# Patient Record
Sex: Female | Born: 1935 | Race: White | Hispanic: No | Smoking: Former smoker
Health system: Southern US, Community
[De-identification: ages and names within clinical notes are randomized; demographics above are authoritative.]

## PROBLEM LIST (undated history)

## (undated) DIAGNOSIS — I4891 Unspecified atrial fibrillation: Secondary | ICD-10-CM

## (undated) DIAGNOSIS — M1711 Unilateral primary osteoarthritis, right knee: Secondary | ICD-10-CM

## (undated) DIAGNOSIS — J45909 Unspecified asthma, uncomplicated: Secondary | ICD-10-CM

## (undated) DIAGNOSIS — Z860100 Personal history of colon polyps, unspecified: Secondary | ICD-10-CM

## (undated) DIAGNOSIS — C50919 Malignant neoplasm of unspecified site of unspecified female breast: Secondary | ICD-10-CM

## (undated) DIAGNOSIS — M1990 Unspecified osteoarthritis, unspecified site: Secondary | ICD-10-CM

## (undated) DIAGNOSIS — I639 Cerebral infarction, unspecified: Secondary | ICD-10-CM

## (undated) DIAGNOSIS — N6011 Diffuse cystic mastopathy of right breast: Secondary | ICD-10-CM

## (undated) DIAGNOSIS — K219 Gastro-esophageal reflux disease without esophagitis: Secondary | ICD-10-CM

## (undated) DIAGNOSIS — I359 Nonrheumatic aortic valve disorder, unspecified: Secondary | ICD-10-CM

## (undated) DIAGNOSIS — I10 Essential (primary) hypertension: Secondary | ICD-10-CM

## (undated) DIAGNOSIS — E785 Hyperlipidemia, unspecified: Secondary | ICD-10-CM

## (undated) DIAGNOSIS — N6012 Diffuse cystic mastopathy of left breast: Secondary | ICD-10-CM

## (undated) DIAGNOSIS — N1831 Chronic kidney disease, stage 3a: Secondary | ICD-10-CM

## (undated) HISTORY — PX: TONSILLECTOMY: SUR1361

## (undated) HISTORY — PX: CATARACT EXTRACTION W/ INTRAOCULAR LENS  IMPLANT, BILATERAL: SHX1307

## (undated) HISTORY — PX: COLONOSCOPY: SHX174

## (undated) HISTORY — PX: APPENDECTOMY: SHX54

## (undated) HISTORY — PX: EYE SURGERY: SHX253

---

## 1966-05-08 HISTORY — PX: ABDOMINAL HYSTERECTOMY: SHX81

## 1971-05-09 HISTORY — PX: OTHER SURGICAL HISTORY: SHX169

## 1984-05-08 HISTORY — PX: BREAST EXCISIONAL BIOPSY: SUR124

## 2002-05-08 DIAGNOSIS — C50919 Malignant neoplasm of unspecified site of unspecified female breast: Secondary | ICD-10-CM

## 2002-05-08 HISTORY — PX: MASTECTOMY: SHX3

## 2002-05-08 HISTORY — DX: Malignant neoplasm of unspecified site of unspecified female breast: C50.919

## 2004-03-08 ENCOUNTER — Ambulatory Visit: Payer: Self-pay | Admitting: Obstetrics and Gynecology

## 2004-03-14 ENCOUNTER — Ambulatory Visit: Payer: Self-pay | Admitting: Obstetrics and Gynecology

## 2004-04-07 ENCOUNTER — Ambulatory Visit: Payer: Self-pay | Admitting: Oncology

## 2004-04-27 ENCOUNTER — Ambulatory Visit: Payer: Self-pay | Admitting: Internal Medicine

## 2004-05-03 ENCOUNTER — Ambulatory Visit: Payer: Self-pay | Admitting: Internal Medicine

## 2004-05-17 ENCOUNTER — Ambulatory Visit: Payer: Self-pay | Admitting: Oncology

## 2004-06-08 ENCOUNTER — Ambulatory Visit: Payer: Self-pay | Admitting: Oncology

## 2004-07-08 ENCOUNTER — Ambulatory Visit: Payer: Self-pay

## 2004-08-01 ENCOUNTER — Ambulatory Visit: Payer: Self-pay | Admitting: Oncology

## 2004-08-08 ENCOUNTER — Ambulatory Visit: Payer: Self-pay | Admitting: Surgery

## 2004-08-09 ENCOUNTER — Ambulatory Visit: Payer: Self-pay | Admitting: Oncology

## 2004-10-04 ENCOUNTER — Ambulatory Visit: Payer: Self-pay | Admitting: Oncology

## 2005-04-04 ENCOUNTER — Ambulatory Visit: Payer: Self-pay | Admitting: Oncology

## 2005-08-30 ENCOUNTER — Ambulatory Visit: Payer: Self-pay | Admitting: Surgery

## 2005-10-02 ENCOUNTER — Ambulatory Visit: Payer: Self-pay | Admitting: Oncology

## 2006-03-12 ENCOUNTER — Ambulatory Visit: Payer: Self-pay | Admitting: Internal Medicine

## 2006-03-23 ENCOUNTER — Ambulatory Visit: Payer: Self-pay | Admitting: Internal Medicine

## 2006-04-02 ENCOUNTER — Ambulatory Visit: Payer: Self-pay | Admitting: Oncology

## 2006-05-08 DIAGNOSIS — I639 Cerebral infarction, unspecified: Secondary | ICD-10-CM | POA: Diagnosis present

## 2006-05-08 HISTORY — DX: Cerebral infarction, unspecified: I63.9

## 2006-06-12 ENCOUNTER — Ambulatory Visit: Payer: Self-pay | Admitting: Gastroenterology

## 2006-09-17 ENCOUNTER — Ambulatory Visit: Payer: Self-pay | Admitting: Surgery

## 2006-10-07 ENCOUNTER — Ambulatory Visit: Payer: Self-pay | Admitting: Oncology

## 2006-10-08 ENCOUNTER — Ambulatory Visit: Payer: Self-pay | Admitting: Oncology

## 2006-11-06 ENCOUNTER — Ambulatory Visit: Payer: Self-pay | Admitting: Oncology

## 2007-04-08 ENCOUNTER — Ambulatory Visit: Payer: Self-pay | Admitting: Oncology

## 2007-04-11 ENCOUNTER — Ambulatory Visit: Payer: Self-pay | Admitting: Oncology

## 2007-05-09 ENCOUNTER — Ambulatory Visit: Payer: Self-pay | Admitting: Oncology

## 2007-05-14 ENCOUNTER — Ambulatory Visit: Payer: Self-pay | Admitting: Oncology

## 2007-06-09 ENCOUNTER — Ambulatory Visit: Payer: Self-pay | Admitting: Oncology

## 2007-07-22 ENCOUNTER — Inpatient Hospital Stay: Payer: Self-pay | Admitting: Specialist

## 2007-07-22 ENCOUNTER — Other Ambulatory Visit: Payer: Self-pay

## 2007-09-20 ENCOUNTER — Ambulatory Visit: Payer: Self-pay | Admitting: Internal Medicine

## 2007-10-23 ENCOUNTER — Ambulatory Visit: Payer: Self-pay | Admitting: Oncology

## 2007-11-06 ENCOUNTER — Ambulatory Visit: Payer: Self-pay | Admitting: Oncology

## 2008-09-22 ENCOUNTER — Ambulatory Visit: Payer: Self-pay | Admitting: Internal Medicine

## 2008-10-06 ENCOUNTER — Ambulatory Visit: Payer: Self-pay | Admitting: Oncology

## 2008-10-26 ENCOUNTER — Ambulatory Visit: Payer: Self-pay | Admitting: Oncology

## 2008-11-05 ENCOUNTER — Ambulatory Visit: Payer: Self-pay | Admitting: Oncology

## 2009-09-23 ENCOUNTER — Ambulatory Visit: Payer: Self-pay | Admitting: Internal Medicine

## 2009-10-06 ENCOUNTER — Ambulatory Visit: Payer: Self-pay | Admitting: Oncology

## 2009-11-04 ENCOUNTER — Ambulatory Visit: Payer: Self-pay | Admitting: Oncology

## 2009-11-05 ENCOUNTER — Ambulatory Visit: Payer: Self-pay | Admitting: Oncology

## 2010-09-26 ENCOUNTER — Ambulatory Visit: Payer: Self-pay | Admitting: Internal Medicine

## 2010-11-22 ENCOUNTER — Ambulatory Visit: Payer: Self-pay | Admitting: Oncology

## 2010-12-07 ENCOUNTER — Ambulatory Visit: Payer: Self-pay | Admitting: Oncology

## 2011-05-09 HISTORY — PX: ESOPHAGOGASTRODUODENOSCOPY: SHX1529

## 2011-10-03 ENCOUNTER — Ambulatory Visit: Payer: Self-pay | Admitting: Internal Medicine

## 2011-11-08 ENCOUNTER — Ambulatory Visit: Payer: Self-pay | Admitting: Internal Medicine

## 2011-12-28 ENCOUNTER — Ambulatory Visit: Payer: Self-pay | Admitting: Gastroenterology

## 2012-01-04 ENCOUNTER — Ambulatory Visit: Payer: Self-pay | Admitting: Gastroenterology

## 2012-10-03 ENCOUNTER — Ambulatory Visit: Payer: Self-pay | Admitting: Internal Medicine

## 2013-10-20 ENCOUNTER — Ambulatory Visit: Payer: Self-pay

## 2014-05-28 ENCOUNTER — Ambulatory Visit: Payer: Self-pay | Admitting: Ophthalmology

## 2014-05-28 DIAGNOSIS — I10 Essential (primary) hypertension: Secondary | ICD-10-CM

## 2014-05-28 LAB — POTASSIUM: Potassium: 4.5 mmol/L (ref 3.5–5.1)

## 2014-06-11 ENCOUNTER — Ambulatory Visit: Payer: Self-pay | Admitting: Ophthalmology

## 2014-06-30 ENCOUNTER — Ambulatory Visit: Payer: Self-pay | Admitting: Ophthalmology

## 2014-07-09 ENCOUNTER — Ambulatory Visit: Payer: Self-pay | Admitting: Ophthalmology

## 2014-09-06 NOTE — Op Note (Signed)
PATIENT NAME:  Colleen, [name] MR#:  [id number] DATE OF BIRTH:  1930-05-24  DATE OF PROCEDURE:  06/11/2014  PREOPERATIVE DIAGNOSIS:  Nuclear sclerotic cataract left eye.  ICD-10 H25.12  POSTOPERATIVE DIAGNOSIS:  Nuclear sclerotic cataract left eye.  PROCEDURE:  Phacoemulsification with posterior chamber intraocular lens implantation of the left eye.   LENS:  ZCB00 22.5-diopter posterior chamber intraocular lens.  ULTRASOUND TIME:  16% of 1 minute, 54 seconds.  CDE 18.5.  SURGEON:  Mali Morocco Gipe, MD  ANESTHESIA:  Topical with tetracaine drops and 2% Xylocaine jelly.  COMPLICATIONS:  None.  DESCRIPTION OF PROCEDURE:  The patient was identified in the holding room and transported to the operating room and placed in the supine position under the operating microscope.  The left eye was identified as the operative eye and it was prepped and draped in the usual sterile ophthalmic fashion.  A 1 millimeter clear-corneal paracentesis was made at the 1:30 position.  The anterior chamber was filled with Viscoat viscoelastic.  A 2.4 millimeter keratome was used to make a near-clear corneal incision at the 10:30 position.  A curvilinear capsulorrhexis was made with a cystotome and capsulorrhexis forceps.  Balanced salt solution was used to hydrodissect and hydrodelineate the nucleus.  Phacoemulsification was then used in stop and chop fashion to remove the lens nucleus and epinucleus.  The remaining cortex was then removed using the irrigation and aspiration handpiece. Provisc was then placed into the capsular bag to distend it for lens placement.  A ZCB00 22.5-diopter lens was then injected into the capsular bag.  The remaining viscoelastic was aspirated.  Wounds were hydrated with balanced salt solution.  The anterior chamber was inflated to a physiologic pressure with balanced salt solution.  0.1 mL of cefuroxime 10 mg/mL were injected into the anterior chamber for a dose of 1 mg of intracameral  antibiotic at the completion of the case. Miostat was placed into the anterior chamber to constrict the pupil.  No wound leaks were noted.  Topical Vigamox drops and Maxitrol ointment were applied to the eye.  The patient was taken to the recovery room in stable condition without complications of anesthesia or surgery.   ____________________________ Wyonia Hough, MD crb:mw D: 06/11/2014 09:01:31 ET T: 06/11/2014 11:42:32 ET JOB#: [id number]  cc: Wyonia Hough, MD, <Dictator> Leandrew Koyanagi MD ELECTRONICALLY SIGNED 06/17/2014 13:27

## 2014-10-01 ENCOUNTER — Other Ambulatory Visit: Payer: Self-pay | Admitting: Infectious Diseases

## 2014-10-01 DIAGNOSIS — Z1231 Encounter for screening mammogram for malignant neoplasm of breast: Secondary | ICD-10-CM

## 2014-10-22 ENCOUNTER — Ambulatory Visit
Admission: RE | Admit: 2014-10-22 | Discharge: 2014-10-22 | Disposition: A | Discharge status: Home / Self Care | Payer: Medicare Other | Source: Ambulatory Visit | Attending: Infectious Diseases | Admitting: Infectious Diseases

## 2014-10-22 ENCOUNTER — Other Ambulatory Visit: Payer: Self-pay | Admitting: Infectious Diseases

## 2014-10-22 DIAGNOSIS — Z9012 Acquired absence of left breast and nipple: Secondary | ICD-10-CM | POA: Insufficient documentation

## 2014-10-22 DIAGNOSIS — Z1231 Encounter for screening mammogram for malignant neoplasm of breast: Secondary | ICD-10-CM

## 2014-11-05 ENCOUNTER — Encounter: Payer: Self-pay | Admitting: Podiatry

## 2014-11-05 ENCOUNTER — Ambulatory Visit (INDEPENDENT_AMBULATORY_CARE_PROVIDER_SITE_OTHER): Payer: Medicare Other | Admitting: Podiatry

## 2014-11-05 VITALS — BP 127/67 | HR 76 | Resp 16 | Wt 154.0 lb

## 2014-11-05 DIAGNOSIS — L600 Ingrowing nail: Secondary | ICD-10-CM

## 2014-11-05 NOTE — Patient Instructions (Signed)
Ingrown Toenail An ingrown toenail occurs when the sharp edge of your toenail grows into the skin. Causes of ingrown toenails include toenails clipped too far back or poorly fitting shoes. Activities involving sudden stops (basketball, tennis) causing "toe jamming" may lead to an ingrown nail. HOME CARE INSTRUCTIONS   Soak the whole foot in warm soapy water for 20 minutes, 3 times per day.  You may lift the edge of the nail away from the sore skin by wedging a small piece of cotton under the corner of the nail. Be careful not to dig (traumatize) and cause more injury to the area.  Wear shoes that fit well. While the ingrown nail is causing problems, sandals may be beneficial.  Trim your toenails regularly and carefully. Cut your toenails straight across, not in a curve. This will prevent injury to the skin at the corners of the toenail.  Keep your feet clean and dry.  Crutches may be helpful early in treatment if walking is painful.  Antibiotics, if prescribed, should be taken as directed.  Return for a wound check in 2 days or as directed.  Only take over-the-counter or prescription medicines for pain, discomfort, or fever as directed by your caregiver. SEEK IMMEDIATE MEDICAL CARE IF:   You have a fever.  You have increasing pain, redness, swelling, or heat at the wound site.  Your toe is not better in 7 days. If conservative treatment is not successful, surgical removal of a portion or all of the nail may be necessary. MAKE SURE YOU:   Understand these instructions.  Will watch your condition.  Will get help right away if you are not doing well or get worse. Document Released: 04/21/2000 Document Revised: 07/17/2011 Document Reviewed: 04/15/2008 Exit[employer] Patient Information 2015 [employer]. This information is not intended to replace advice given to you by your health care provider. Make sure you discuss any questions you have with your health care provider.  

## 2014-11-06 NOTE — Progress Notes (Signed)
Subjective:     Patient ID: Colleen [name], female   DOB: 22-Dec-1930, 81 y.o.   MRN: [id number]  HPI 81 year old female presents the office with concerns of bilateral hallux ingrown toenails which is been ongoing for several weeks. She states that she had a pedicure proximal 6-8 weeks ago and since then she presents today were cut to short she has developed ingrown toenails. She states that she has some mild discomfort along the nails on into the toenails. She denies any redness or any swelling. She denies any drainage or purulence. She said no prior treatment. No other complaints at this time.  Review of Systems  All other systems reviewed and are negative.      Objective:   Physical Exam AAO x3, NAD DP/PT pulses palpable bilaterally, CRT less than 3 seconds Protective sensation intact with Simms Weinstein monofilament, vibratory sensation intact, Achilles tendon reflex intact Bilateral hallux toenails are incurvated on both the medial and lateral aspects of the toenail. There is tenderness up amputation on the distal aspect of the toenail however there is no pain on the proximal nail borders. There is no surrounding edema, erythema, increase in warmth, drainage/purulence. There is no clinical signs of infection. Remaining nails are also slightly incurvated however there without pain or any surrounding redness or drainage. No other areas of tenderness to bilateral lower extremities. MMT 5/5, ROM WNL.  No open lesions or pre-ulcerative lesions.  No overlying edema, erythema, increase in warmth to bilateral lower extremities.  No pain with calf compression, swelling, warmth, erythema bilaterally.      Assessment:     81 year old female with bilateral hallux ingrown toenail    Plan:     -Treatment options discussed including all alternatives, risks, and complications -Discussed possible partial nail avulsion versus nail debridement. Patient wishes to hold off on a nail procedure. Bilateral  hallux nails are sharply debrided without complications to remove the symptomatic portion of the ingrown toenail without complication/bleeding. Continue to monitor for any reoccurrence. Monitor for any clinical signs or symptoms of infection and directed to call the office immediately should any occur or go to the ER. -Follow-up as needed. In the meantime I encouraged her to call the office with any questions, concerns, change in symptoms.  Celesta Gentile, DPM

## 2015-09-10 ENCOUNTER — Other Ambulatory Visit: Payer: Self-pay | Admitting: Infectious Diseases

## 2015-09-10 DIAGNOSIS — Z1231 Encounter for screening mammogram for malignant neoplasm of breast: Secondary | ICD-10-CM

## 2015-10-25 ENCOUNTER — Ambulatory Visit
Admission: RE | Admit: 2015-10-25 | Discharge: 2015-10-25 | Disposition: A | Discharge status: Home / Self Care | Payer: Medicare Other | Source: Ambulatory Visit | Attending: Infectious Diseases | Admitting: Infectious Diseases

## 2015-10-25 ENCOUNTER — Other Ambulatory Visit: Payer: Self-pay | Admitting: Infectious Diseases

## 2015-10-25 DIAGNOSIS — Z1231 Encounter for screening mammogram for malignant neoplasm of breast: Secondary | ICD-10-CM

## 2015-10-25 HISTORY — DX: Malignant neoplasm of unspecified site of unspecified female breast: C50.919

## 2016-09-13 ENCOUNTER — Other Ambulatory Visit: Payer: Self-pay | Admitting: Infectious Diseases

## 2016-09-13 DIAGNOSIS — Z1231 Encounter for screening mammogram for malignant neoplasm of breast: Secondary | ICD-10-CM

## 2016-10-25 ENCOUNTER — Ambulatory Visit
Admission: RE | Admit: 2016-10-25 | Discharge: 2016-10-25 | Disposition: A | Discharge status: Home / Self Care | Payer: Medicare Other | Source: Ambulatory Visit | Attending: Infectious Diseases | Admitting: Infectious Diseases

## 2016-10-25 DIAGNOSIS — Z1231 Encounter for screening mammogram for malignant neoplasm of breast: Secondary | ICD-10-CM | POA: Insufficient documentation

## 2017-05-08 DIAGNOSIS — M1711 Unilateral primary osteoarthritis, right knee: Secondary | ICD-10-CM

## 2017-05-08 HISTORY — DX: Unilateral primary osteoarthritis, right knee: M17.11

## 2017-09-13 ENCOUNTER — Other Ambulatory Visit: Payer: Self-pay | Admitting: Infectious Diseases

## 2017-09-13 DIAGNOSIS — Z1231 Encounter for screening mammogram for malignant neoplasm of breast: Secondary | ICD-10-CM

## 2017-09-25 ENCOUNTER — Other Ambulatory Visit: Payer: Self-pay | Admitting: Student

## 2017-09-25 DIAGNOSIS — M1711 Unilateral primary osteoarthritis, right knee: Secondary | ICD-10-CM

## 2017-10-09 ENCOUNTER — Ambulatory Visit
Admission: RE | Admit: 2017-10-09 | Discharge: 2017-10-09 | Disposition: A | Discharge status: Home / Self Care | Payer: Medicare Other | Source: Ambulatory Visit | Attending: Student | Admitting: Student

## 2017-10-09 DIAGNOSIS — S83241A Other tear of medial meniscus, current injury, right knee, initial encounter: Secondary | ICD-10-CM | POA: Insufficient documentation

## 2017-10-09 DIAGNOSIS — R600 Localized edema: Secondary | ICD-10-CM | POA: Insufficient documentation

## 2017-10-09 DIAGNOSIS — M1711 Unilateral primary osteoarthritis, right knee: Secondary | ICD-10-CM | POA: Insufficient documentation

## 2017-10-09 DIAGNOSIS — M7121 Synovial cyst of popliteal space [Baker], right knee: Secondary | ICD-10-CM | POA: Insufficient documentation

## 2017-10-09 DIAGNOSIS — M25461 Effusion, right knee: Secondary | ICD-10-CM | POA: Diagnosis not present

## 2017-10-09 DIAGNOSIS — X58XXXA Exposure to other specified factors, initial encounter: Secondary | ICD-10-CM | POA: Insufficient documentation

## 2017-10-29 ENCOUNTER — Other Ambulatory Visit: Payer: Self-pay | Admitting: Infectious Diseases

## 2017-10-29 ENCOUNTER — Ambulatory Visit
Admission: RE | Admit: 2017-10-29 | Discharge: 2017-10-29 | Disposition: A | Discharge status: Home / Self Care | Payer: Medicare Other | Source: Ambulatory Visit | Attending: Infectious Diseases | Admitting: Infectious Diseases

## 2017-10-29 DIAGNOSIS — Z1231 Encounter for screening mammogram for malignant neoplasm of breast: Secondary | ICD-10-CM

## 2017-10-31 ENCOUNTER — Other Ambulatory Visit: Payer: Self-pay

## 2017-10-31 ENCOUNTER — Encounter
Admission: RE | Admit: 2017-10-31 | Discharge: 2017-10-31 | Disposition: A | Discharge status: Home / Self Care | Payer: Medicare Other | Source: Ambulatory Visit | Attending: Surgery | Admitting: Surgery

## 2017-10-31 ENCOUNTER — Ambulatory Visit
Admission: RE | Admit: 2017-10-31 | Discharge: 2017-10-31 | Disposition: A | Discharge status: Home / Self Care | Payer: Medicare Other | Source: Ambulatory Visit | Attending: Surgery | Admitting: Surgery

## 2017-10-31 DIAGNOSIS — Z01818 Encounter for other preprocedural examination: Secondary | ICD-10-CM | POA: Diagnosis present

## 2017-10-31 DIAGNOSIS — Z01810 Encounter for preprocedural cardiovascular examination: Secondary | ICD-10-CM | POA: Diagnosis present

## 2017-10-31 DIAGNOSIS — M1711 Unilateral primary osteoarthritis, right knee: Secondary | ICD-10-CM | POA: Diagnosis not present

## 2017-10-31 DIAGNOSIS — Z8709 Personal history of other diseases of the respiratory system: Secondary | ICD-10-CM

## 2017-10-31 DIAGNOSIS — I4891 Unspecified atrial fibrillation: Secondary | ICD-10-CM | POA: Insufficient documentation

## 2017-10-31 DIAGNOSIS — Z01812 Encounter for preprocedural laboratory examination: Secondary | ICD-10-CM | POA: Diagnosis present

## 2017-10-31 HISTORY — DX: Gastro-esophageal reflux disease without esophagitis: K21.9

## 2017-10-31 HISTORY — DX: Diffuse cystic mastopathy of right breast: N60.11

## 2017-10-31 HISTORY — DX: Hyperlipidemia, unspecified: E78.5

## 2017-10-31 HISTORY — DX: Essential (primary) hypertension: I10

## 2017-10-31 HISTORY — DX: Malignant neoplasm of unspecified site of unspecified female breast: C50.919

## 2017-10-31 HISTORY — DX: Unilateral primary osteoarthritis, right knee: M17.11

## 2017-10-31 HISTORY — DX: Personal history of colonic polyps: Z86.010

## 2017-10-31 HISTORY — DX: Unspecified osteoarthritis, unspecified site: M19.90

## 2017-10-31 HISTORY — DX: Cerebral infarction, unspecified: I63.9

## 2017-10-31 HISTORY — DX: Diffuse cystic mastopathy of left breast: N60.12

## 2017-10-31 HISTORY — DX: Personal history of colon polyps, unspecified: Z86.0100

## 2017-10-31 HISTORY — DX: Unspecified asthma, uncomplicated: J45.909

## 2017-10-31 HISTORY — DX: Nonrheumatic aortic valve disorder, unspecified: I35.9

## 2017-10-31 HISTORY — DX: Unspecified atrial fibrillation: I48.91

## 2017-10-31 LAB — URINALYSIS, ROUTINE W REFLEX MICROSCOPIC
BILIRUBIN URINE: NEGATIVE
Glucose, UA: NEGATIVE mg/dL
Hgb urine dipstick: NEGATIVE
KETONES UR: NEGATIVE mg/dL
LEUKOCYTES UA: NEGATIVE
NITRITE: NEGATIVE
PH: 5 (ref 5.0–8.0)
PROTEIN: NEGATIVE mg/dL
Specific Gravity, Urine: 1.009 (ref 1.005–1.030)

## 2017-10-31 LAB — BASIC METABOLIC PANEL
ANION GAP: 11 (ref 5–15)
BUN: 11 mg/dL (ref 8–23)
CHLORIDE: 105 mmol/L (ref 98–111)
CO2: 27 mmol/L (ref 22–32)
Calcium: 10.1 mg/dL (ref 8.9–10.3)
Creatinine, Ser: 0.84 mg/dL (ref 0.44–1.00)
Glucose, Bld: 90 mg/dL (ref 70–99)
POTASSIUM: 3.6 mmol/L (ref 3.5–5.1)
SODIUM: 143 mmol/L (ref 135–145)

## 2017-10-31 LAB — CBC
HCT: 42.7 % (ref 35.0–47.0)
HEMOGLOBIN: 14.1 g/dL (ref 12.0–16.0)
MCH: 31.6 pg (ref 26.0–34.0)
MCHC: 33.1 g/dL (ref 32.0–36.0)
MCV: 95.5 fL (ref 80.0–100.0)
PLATELETS: 196 10*3/uL (ref 150–440)
RBC: 4.47 MIL/uL (ref 3.80–5.20)
RDW: 12.9 % (ref 11.5–14.5)
WBC: 6.8 10*3/uL (ref 3.6–11.0)

## 2017-10-31 LAB — PROTIME-INR
INR: 1.4
PROTHROMBIN TIME: 17 s — AB (ref 11.4–15.2)

## 2017-10-31 LAB — TYPE AND SCREEN
ABO/RH(D): A POS
Antibody Screen: NEGATIVE

## 2017-10-31 LAB — SURGICAL PCR SCREEN
MRSA, PCR: NEGATIVE
STAPHYLOCOCCUS AUREUS: NEGATIVE

## 2017-10-31 NOTE — Patient Instructions (Signed)
Your procedure is scheduled on:  Tuesday, November 13, 2017 Report to Day Surgery on the 2nd floor of the [facility]. To find out your arrival time, please call [phone] between 1PM - 3PM on: Monday, November 12, 2017  REMEMBER: Instructions that are not followed completely may result in serious medical risk, up to and including death; or upon the discretion of your surgeon and anesthesiologist your surgery may need to be rescheduled.  Do not eat food after midnight the night before your procedure.  No gum chewing, lozengers or hard candies.  You may however, drink CLEAR liquids up to 2 hours before you are scheduled to arrive for your surgery. Do not drink anything within 2 hours of the start of your surgery.  Clear liquids include: - water  - apple juice without pulp - clear gatorade - black coffee or tea (Do NOT add anything to the coffee or tea) Do NOT drink anything that is not on this list.  No Alcohol for 24 hours before or after surgery.  No Smoking including e-cigarettes for 24 hours prior to surgery.  No chewable tobacco products for at least 6 hours prior to surgery.  No nicotine patches on the day of surgery.  On the morning of surgery brush your teeth with toothpaste and water, you may rinse your mouth with mouthwash if you wish. Do not swallow any toothpaste or mouthwash.  Notify your doctor if there is any change in your medical condition (cold, fever, infection).  Do not wear jewelry, make-up, hairpins, clips or nail polish.  Do not wear lotions, powders, or perfumes. You may wear deodorant.  Do not shave 48 hours prior to surgery.   Contacts and dentures may not be worn into surgery.  Do not bring valuables to the hospital, including drivers license, insurance or credit cards.  [facility] is not responsible for any belongings or valuables.   TAKE THESE MEDICATIONS THE MORNING OF SURGERY:  1.  ATENOLOL 2.  NIFEDIPINE  Use CHG Soap as directed on  instruction sheet.  Follow recommendations from Cardiologist regarding stopping Pradaxa. LAST DAY TO TAKE IS July 3. DO NOT TAKE July 4 THROUGH July 9. RESTART ON July 10.  ON JULY 2 - Stop Anti-inflammatories (NSAIDS) such as Advil, Aleve, Ibuprofen, Motrin, Naproxen, Naprosyn and Aspirin based products such as Excedrin, Goodys Powder, BC Powder. (May take Tylenol or Acetaminophen if needed.)  ON July 2 - Stop ANY OVER THE COUNTER supplements until after surgery. (CALCIUM, FOLIC ACID, OSTEO BI-FLEX, FISH OIL, VITAMIN C, VITAMIN E) (May continue Vitamin D, Vitamin B, and multivitamin.)  Wear comfortable clothing (specific to your surgery type) to the hospital.  Plan for stool softeners for home use.  If you are being admitted to the hospital overnight, leave your suitcase in the car. After surgery it may be brought to your room.  Please call [phone] if you have any questions about these instructions.

## 2017-11-02 NOTE — Pre-Procedure Instructions (Signed)
Confirmed EKG FYI faxed to Dr. Sharlet Salina office and Dr. Nicholaus Bloom office.

## 2017-11-13 ENCOUNTER — Inpatient Hospital Stay: Payer: Medicare Other

## 2017-11-13 ENCOUNTER — Inpatient Hospital Stay
Admission: RE | Admit: 2017-11-13 | Discharge: 2017-11-16 | DRG: 469 | Disposition: A | Discharge status: Skilled Nursing Facility | Payer: Medicare Other | Attending: Surgery | Admitting: Surgery

## 2017-11-13 ENCOUNTER — Encounter
Admission: RE | Disposition: A | Discharge status: Skilled Nursing Facility | Payer: Self-pay | Source: Ambulatory Visit | Attending: Surgery

## 2017-11-13 ENCOUNTER — Other Ambulatory Visit: Payer: Self-pay

## 2017-11-13 DIAGNOSIS — Z888 Allergy status to other drugs, medicaments and biological substances status: Secondary | ICD-10-CM

## 2017-11-13 DIAGNOSIS — Z9012 Acquired absence of left breast and nipple: Secondary | ICD-10-CM | POA: Diagnosis not present

## 2017-11-13 DIAGNOSIS — Z853 Personal history of malignant neoplasm of breast: Secondary | ICD-10-CM | POA: Diagnosis not present

## 2017-11-13 DIAGNOSIS — K219 Gastro-esophageal reflux disease without esophagitis: Secondary | ICD-10-CM | POA: Diagnosis present

## 2017-11-13 DIAGNOSIS — Z9842 Cataract extraction status, left eye: Secondary | ICD-10-CM

## 2017-11-13 DIAGNOSIS — Z87891 Personal history of nicotine dependence: Secondary | ICD-10-CM | POA: Diagnosis not present

## 2017-11-13 DIAGNOSIS — A419 Sepsis, unspecified organism: Secondary | ICD-10-CM | POA: Diagnosis not present

## 2017-11-13 DIAGNOSIS — Z8249 Family history of ischemic heart disease and other diseases of the circulatory system: Secondary | ICD-10-CM | POA: Diagnosis not present

## 2017-11-13 DIAGNOSIS — Z8673 Personal history of transient ischemic attack (TIA), and cerebral infarction without residual deficits: Secondary | ICD-10-CM | POA: Diagnosis not present

## 2017-11-13 DIAGNOSIS — E785 Hyperlipidemia, unspecified: Secondary | ICD-10-CM | POA: Diagnosis present

## 2017-11-13 DIAGNOSIS — Z9841 Cataract extraction status, right eye: Secondary | ICD-10-CM | POA: Diagnosis not present

## 2017-11-13 DIAGNOSIS — I359 Nonrheumatic aortic valve disorder, unspecified: Secondary | ICD-10-CM | POA: Diagnosis present

## 2017-11-13 DIAGNOSIS — Z7951 Long term (current) use of inhaled steroids: Secondary | ICD-10-CM | POA: Diagnosis not present

## 2017-11-13 DIAGNOSIS — Z961 Presence of intraocular lens: Secondary | ICD-10-CM | POA: Diagnosis present

## 2017-11-13 DIAGNOSIS — E876 Hypokalemia: Secondary | ICD-10-CM | POA: Diagnosis not present

## 2017-11-13 DIAGNOSIS — J9601 Acute respiratory failure with hypoxia: Secondary | ICD-10-CM | POA: Diagnosis not present

## 2017-11-13 DIAGNOSIS — Z803 Family history of malignant neoplasm of breast: Secondary | ICD-10-CM | POA: Diagnosis not present

## 2017-11-13 DIAGNOSIS — M1711 Unilateral primary osteoarthritis, right knee: Principal | ICD-10-CM | POA: Diagnosis present

## 2017-11-13 DIAGNOSIS — Z90710 Acquired absence of both cervix and uterus: Secondary | ICD-10-CM | POA: Diagnosis not present

## 2017-11-13 DIAGNOSIS — J9811 Atelectasis: Secondary | ICD-10-CM | POA: Diagnosis not present

## 2017-11-13 DIAGNOSIS — I480 Paroxysmal atrial fibrillation: Secondary | ICD-10-CM | POA: Diagnosis present

## 2017-11-13 DIAGNOSIS — Z96651 Presence of right artificial knee joint: Secondary | ICD-10-CM

## 2017-11-13 DIAGNOSIS — Z885 Allergy status to narcotic agent status: Secondary | ICD-10-CM

## 2017-11-13 DIAGNOSIS — I10 Essential (primary) hypertension: Secondary | ICD-10-CM | POA: Diagnosis present

## 2017-11-13 HISTORY — PX: TOTAL KNEE ARTHROPLASTY: SHX125

## 2017-11-13 LAB — PROTIME-INR
INR: 1
Prothrombin Time: 13.1 seconds (ref 11.4–15.2)

## 2017-11-13 LAB — ABO/RH: ABO/RH(D): A POS

## 2017-11-13 SURGERY — ARTHROPLASTY, KNEE, TOTAL
Anesthesia: Spinal | Site: Knee | Laterality: Right | Wound class: Clean

## 2017-11-13 MED ORDER — ONDANSETRON HCL 4 MG/2ML IJ SOLN: 4.0000 mg | Freq: Four times a day (QID) | INTRAMUSCULAR | Status: DC | PRN

## 2017-11-13 MED ORDER — LISINOPRIL 20 MG PO TABS
40.0000 mg | ORAL_TABLET | Freq: Two times a day (BID) | ORAL | Status: DC
  Administered 2017-11-13 – 2017-11-15 (×3): 40 mg via ORAL
  Filled 2017-11-13 (×4): qty 2

## 2017-11-13 MED ORDER — KETOROLAC TROMETHAMINE 30 MG/ML IJ SOLN
INTRAMUSCULAR | Status: AC
  Filled 2017-11-13: qty 1

## 2017-11-13 MED ORDER — CHOLECALCIFEROL 10 MCG (400 UNIT) PO TABS
400.0000 [IU] | ORAL_TABLET | Freq: Every day | ORAL | Status: DC
Start: 2017-11-14 — End: 2017-11-16
  Administered 2017-11-14 – 2017-11-16 (×3): 400 [IU] via ORAL
  Filled 2017-11-13 (×3): qty 1

## 2017-11-13 MED ORDER — BUPIVACAINE LIPOSOME 1.3 % IJ SUSP
INTRAMUSCULAR | Status: AC
  Filled 2017-11-13: qty 20

## 2017-11-13 MED ORDER — NEOMYCIN-POLYMYXIN B GU 40-200000 IR SOLN
Status: AC
  Filled 2017-11-13: qty 20

## 2017-11-13 MED ORDER — ONDANSETRON HCL 4 MG/2ML IJ SOLN
INTRAMUSCULAR | Status: DC | PRN
  Administered 2017-11-13: 4 mg via INTRAVENOUS

## 2017-11-13 MED ORDER — PANTOPRAZOLE SODIUM 40 MG PO TBEC
40.0000 mg | DELAYED_RELEASE_TABLET | Freq: Every day | ORAL | Status: DC
  Administered 2017-11-14 – 2017-11-16 (×3): 40 mg via ORAL
  Filled 2017-11-13 (×3): qty 1

## 2017-11-13 MED ORDER — TRAMADOL HCL 50 MG PO TABS
50.0000 mg | ORAL_TABLET | Freq: Four times a day (QID) | ORAL | Status: DC
  Administered 2017-11-13 – 2017-11-16 (×10): 50 mg via ORAL
  Filled 2017-11-13 (×10): qty 1

## 2017-11-13 MED ORDER — LACTATED RINGERS IV SOLN
INTRAVENOUS | Status: DC
  Administered 2017-11-13 (×2): via INTRAVENOUS

## 2017-11-13 MED ORDER — ONDANSETRON HCL 4 MG/2ML IJ SOLN
INTRAMUSCULAR | Status: AC
  Filled 2017-11-13: qty 2

## 2017-11-13 MED ORDER — LIDOCAINE HCL (CARDIAC) PF 100 MG/5ML IV SOSY
PREFILLED_SYRINGE | INTRAVENOUS | Status: DC | PRN
  Administered 2017-11-13: 50 mg via INTRAVENOUS

## 2017-11-13 MED ORDER — DEXAMETHASONE SODIUM PHOSPHATE 10 MG/ML IJ SOLN
INTRAMUSCULAR | Status: AC
  Filled 2017-11-13: qty 1

## 2017-11-13 MED ORDER — TRANEXAMIC ACID 1000 MG/10ML IV SOLN
INTRAVENOUS | Status: AC
  Filled 2017-11-13: qty 10

## 2017-11-13 MED ORDER — CEFAZOLIN SODIUM-DEXTROSE 2-4 GM/100ML-% IV SOLN
INTRAVENOUS | Status: AC
  Filled 2017-11-13: qty 100

## 2017-11-13 MED ORDER — BUPIVACAINE LIPOSOME 1.3 % IJ SUSP
INTRAMUSCULAR | Status: DC | PRN
  Administered 2017-11-13: 60 mL

## 2017-11-13 MED ORDER — PROPOFOL 10 MG/ML IV BOLUS
INTRAVENOUS | Status: AC
  Filled 2017-11-13: qty 20

## 2017-11-13 MED ORDER — ACETAMINOPHEN 10 MG/ML IV SOLN
INTRAVENOUS | Status: DC | PRN
  Administered 2017-11-13: 1000 mg via INTRAVENOUS

## 2017-11-13 MED ORDER — OXYCODONE HCL 5 MG/5ML PO SOLN: 5.0000 mg | Freq: Once | ORAL | Status: DC | PRN

## 2017-11-13 MED ORDER — VITAMIN C 500 MG PO TABS
1000.0000 mg | ORAL_TABLET | Freq: Every day | ORAL | Status: DC
  Administered 2017-11-14 – 2017-11-16 (×3): 1000 mg via ORAL
  Filled 2017-11-13 (×3): qty 2

## 2017-11-13 MED ORDER — SODIUM CHLORIDE FLUSH 0.9 % IV SOLN
INTRAVENOUS | Status: AC
  Filled 2017-11-13: qty 40

## 2017-11-13 MED ORDER — VITAMIN E 180 MG (400 UNIT) PO CAPS
400.0000 [IU] | ORAL_CAPSULE | Freq: Every day | ORAL | Status: DC
  Administered 2017-11-14 – 2017-11-15 (×2): 400 [IU] via ORAL
  Filled 2017-11-13 (×3): qty 1

## 2017-11-13 MED ORDER — SODIUM CHLORIDE 0.9 % IJ SOLN
INTRAMUSCULAR | Status: AC
  Filled 2017-11-13: qty 50

## 2017-11-13 MED ORDER — BUPIVACAINE HCL (PF) 0.5 % IJ SOLN
INTRAMUSCULAR | Status: AC
  Filled 2017-11-13: qty 10

## 2017-11-13 MED ORDER — TRANEXAMIC ACID 1000 MG/10ML IV SOLN
INTRAVENOUS | Status: DC | PRN
  Administered 2017-11-13: 1000 mg via TOPICAL

## 2017-11-13 MED ORDER — PROPOFOL 500 MG/50ML IV EMUL
INTRAVENOUS | Status: AC
  Filled 2017-11-13: qty 50

## 2017-11-13 MED ORDER — DOCUSATE SODIUM 100 MG PO CAPS
100.0000 mg | ORAL_CAPSULE | Freq: Two times a day (BID) | ORAL | Status: DC
  Administered 2017-11-13 – 2017-11-16 (×6): 100 mg via ORAL
  Filled 2017-11-13 (×6): qty 1

## 2017-11-13 MED ORDER — NEOMYCIN-POLYMYXIN B GU 40-200000 IR SOLN
Status: DC | PRN
  Administered 2017-11-13: 14 mL

## 2017-11-13 MED ORDER — FUROSEMIDE 20 MG PO TABS
20.0000 mg | ORAL_TABLET | Freq: Every day | ORAL | Status: DC
  Filled 2017-11-13: qty 1

## 2017-11-13 MED ORDER — SPIRONOLACTONE 25 MG PO TABS
25.0000 mg | ORAL_TABLET | Freq: Every day | ORAL | Status: DC
  Administered 2017-11-15: 25 mg via ORAL
  Filled 2017-11-13: qty 1

## 2017-11-13 MED ORDER — NIFEDIPINE ER OSMOTIC RELEASE 30 MG PO TB24
30.0000 mg | ORAL_TABLET | Freq: Every day | ORAL | Status: DC
  Administered 2017-11-14 – 2017-11-15 (×2): 30 mg via ORAL
  Filled 2017-11-13 (×3): qty 1

## 2017-11-13 MED ORDER — OMEGA-3-ACID ETHYL ESTERS 1 G PO CAPS
1.0000 g | ORAL_CAPSULE | Freq: Every day | ORAL | Status: DC
  Administered 2017-11-14 – 2017-11-16 (×3): 1 g via ORAL
  Filled 2017-11-13 (×3): qty 1

## 2017-11-13 MED ORDER — METOCLOPRAMIDE HCL 10 MG PO TABS: 5.0000 mg | ORAL_TABLET | Freq: Three times a day (TID) | ORAL | Status: DC | PRN

## 2017-11-13 MED ORDER — OXYCODONE HCL 5 MG PO TABS: 5.0000 mg | ORAL_TABLET | Freq: Once | ORAL | Status: DC | PRN

## 2017-11-13 MED ORDER — LIDOCAINE HCL (PF) 2 % IJ SOLN
INTRAMUSCULAR | Status: AC
  Filled 2017-11-13: qty 10

## 2017-11-13 MED ORDER — SODIUM CHLORIDE 0.9 % IV SOLN
INTRAVENOUS | Status: DC
  Administered 2017-11-13 – 2017-11-14 (×2): via INTRAVENOUS

## 2017-11-13 MED ORDER — B COMPLEX-C PO TABS
1.0000 | ORAL_TABLET | Freq: Every day | ORAL | Status: DC
  Administered 2017-11-14 – 2017-11-16 (×2): 1 via ORAL
  Filled 2017-11-13 (×4): qty 1

## 2017-11-13 MED ORDER — FLEET ENEMA 7-19 GM/118ML RE ENEM: 1.0000 | ENEMA | Freq: Once | RECTAL | Status: DC | PRN

## 2017-11-13 MED ORDER — PROPOFOL 10 MG/ML IV BOLUS
INTRAVENOUS | Status: DC | PRN
  Administered 2017-11-13: 20 mg via INTRAVENOUS
  Administered 2017-11-13: 30 mg via INTRAVENOUS

## 2017-11-13 MED ORDER — DABIGATRAN ETEXILATE MESYLATE 150 MG PO CAPS
150.0000 mg | ORAL_CAPSULE | Freq: Two times a day (BID) | ORAL | Status: DC
  Administered 2017-11-14 – 2017-11-16 (×5): 150 mg via ORAL
  Filled 2017-11-13 (×6): qty 1

## 2017-11-13 MED ORDER — BUPIVACAINE-EPINEPHRINE (PF) 0.5% -1:200000 IJ SOLN
INTRAMUSCULAR | Status: DC | PRN
  Administered 2017-11-13: 30 mL via PERINEURAL

## 2017-11-13 MED ORDER — ACETAMINOPHEN 325 MG PO TABS
325.0000 mg | ORAL_TABLET | Freq: Four times a day (QID) | ORAL | Status: DC | PRN
  Administered 2017-11-14: 650 mg via ORAL
  Filled 2017-11-13: qty 2

## 2017-11-13 MED ORDER — FENTANYL CITRATE (PF) 100 MCG/2ML IJ SOLN
INTRAMUSCULAR | Status: DC | PRN
  Administered 2017-11-13 (×2): 25 ug via INTRAVENOUS

## 2017-11-13 MED ORDER — FAMOTIDINE 20 MG PO TABS
20.0000 mg | ORAL_TABLET | Freq: Once | ORAL | Status: AC
  Administered 2017-11-13: 20 mg via ORAL

## 2017-11-13 MED ORDER — BUPIVACAINE HCL (PF) 0.5 % IJ SOLN
INTRAMUSCULAR | Status: DC | PRN
  Administered 2017-11-13: 3 mL

## 2017-11-13 MED ORDER — OXYCODONE HCL 5 MG PO TABS
5.0000 mg | ORAL_TABLET | ORAL | Status: DC | PRN
  Administered 2017-11-13 – 2017-11-14 (×3): 5 mg via ORAL
  Filled 2017-11-13 (×3): qty 1

## 2017-11-13 MED ORDER — CALCIUM-VITAMIN D 500-200 MG-UNIT PO TABS
1.0000 | ORAL_TABLET | Freq: Every day | ORAL | Status: DC
  Administered 2017-11-14 – 2017-11-16 (×2): 1 via ORAL
  Filled 2017-11-13 (×3): qty 1

## 2017-11-13 MED ORDER — CEFAZOLIN SODIUM-DEXTROSE 2-4 GM/100ML-% IV SOLN
2.0000 g | Freq: Once | INTRAVENOUS | Status: AC
Start: 2017-11-13 — End: 2017-11-13
  Administered 2017-11-13: 2 g via INTRAVENOUS

## 2017-11-13 MED ORDER — DEXAMETHASONE SODIUM PHOSPHATE 10 MG/ML IJ SOLN
INTRAMUSCULAR | Status: DC | PRN
  Administered 2017-11-13: 5 mg via INTRAVENOUS

## 2017-11-13 MED ORDER — PROPOFOL 500 MG/50ML IV EMUL
INTRAVENOUS | Status: DC | PRN
  Administered 2017-11-13: 120 ug/kg/min via INTRAVENOUS

## 2017-11-13 MED ORDER — ATENOLOL 25 MG PO TABS
50.0000 mg | ORAL_TABLET | Freq: Two times a day (BID) | ORAL | Status: DC
  Administered 2017-11-13 – 2017-11-15 (×4): 50 mg via ORAL
  Filled 2017-11-13 (×5): qty 2

## 2017-11-13 MED ORDER — FENTANYL CITRATE (PF) 100 MCG/2ML IJ SOLN
INTRAMUSCULAR | Status: AC
  Filled 2017-11-13: qty 2

## 2017-11-13 MED ORDER — FLUTICASONE PROPIONATE 50 MCG/ACT NA SUSP
2.0000 | Freq: Every day | NASAL | Status: DC
Start: 2017-11-14 — End: 2017-11-16
  Administered 2017-11-15: 2 via NASAL
  Filled 2017-11-13: qty 16

## 2017-11-13 MED ORDER — ACETAMINOPHEN 500 MG PO TABS
1000.0000 mg | ORAL_TABLET | Freq: Four times a day (QID) | ORAL | Status: AC
  Administered 2017-11-13 – 2017-11-14 (×3): 1000 mg via ORAL
  Filled 2017-11-13 (×4): qty 2

## 2017-11-13 MED ORDER — BISACODYL 10 MG RE SUPP
10.0000 mg | Freq: Every day | RECTAL | Status: DC | PRN
  Administered 2017-11-16: 10 mg via RECTAL
  Filled 2017-11-13: qty 1

## 2017-11-13 MED ORDER — ONDANSETRON HCL 4 MG PO TABS: 4.0000 mg | ORAL_TABLET | Freq: Four times a day (QID) | ORAL | Status: DC | PRN

## 2017-11-13 MED ORDER — HYDROMORPHONE HCL 1 MG/ML IJ SOLN: 0.5000 mg | INTRAMUSCULAR | Status: DC | PRN

## 2017-11-13 MED ORDER — BUPIVACAINE-EPINEPHRINE (PF) 0.5% -1:200000 IJ SOLN
INTRAMUSCULAR | Status: AC
  Filled 2017-11-13: qty 30

## 2017-11-13 MED ORDER — ROSUVASTATIN CALCIUM 10 MG PO TABS
10.0000 mg | ORAL_TABLET | Freq: Every day | ORAL | Status: DC
  Administered 2017-11-13 – 2017-11-15 (×3): 10 mg via ORAL
  Filled 2017-11-13 (×3): qty 1

## 2017-11-13 MED ORDER — DIPHENHYDRAMINE HCL 12.5 MG/5ML PO ELIX: 12.5000 mg | ORAL_SOLUTION | ORAL | Status: DC | PRN

## 2017-11-13 MED ORDER — ADULT MULTIVITAMIN W/MINERALS CH
1.0000 | ORAL_TABLET | Freq: Every day | ORAL | Status: DC
Start: 2017-11-14 — End: 2017-11-16
  Administered 2017-11-14 – 2017-11-16 (×3): 1 via ORAL
  Filled 2017-11-13 (×3): qty 1

## 2017-11-13 MED ORDER — FAMOTIDINE 20 MG PO TABS
ORAL_TABLET | ORAL | Status: AC
  Administered 2017-11-13: 20 mg via ORAL
  Filled 2017-11-13: qty 1

## 2017-11-13 MED ORDER — METOCLOPRAMIDE HCL 5 MG/ML IJ SOLN: 5.0000 mg | Freq: Three times a day (TID) | INTRAMUSCULAR | Status: DC | PRN

## 2017-11-13 MED ORDER — LORATADINE 10 MG PO TABS
10.0000 mg | ORAL_TABLET | Freq: Every day | ORAL | Status: DC
  Administered 2017-11-14 – 2017-11-16 (×3): 10 mg via ORAL
  Filled 2017-11-13 (×3): qty 1

## 2017-11-13 MED ORDER — MAGNESIUM HYDROXIDE 400 MG/5ML PO SUSP
30.0000 mL | Freq: Every day | ORAL | Status: DC | PRN
  Administered 2017-11-15 – 2017-11-16 (×2): 30 mL via ORAL
  Filled 2017-11-13 (×2): qty 30

## 2017-11-13 MED ORDER — CEFAZOLIN SODIUM-DEXTROSE 2-4 GM/100ML-% IV SOLN
2.0000 g | Freq: Four times a day (QID) | INTRAVENOUS | Status: AC
  Administered 2017-11-13 – 2017-11-14 (×3): 2 g via INTRAVENOUS
  Filled 2017-11-13 (×3): qty 100

## 2017-11-13 MED ORDER — MONTELUKAST SODIUM 10 MG PO TABS
10.0000 mg | ORAL_TABLET | Freq: Every day | ORAL | Status: DC
  Administered 2017-11-13 – 2017-11-15 (×3): 10 mg via ORAL
  Filled 2017-11-13 (×3): qty 1

## 2017-11-13 MED ORDER — FOLIC ACID 1 MG PO TABS
500.0000 ug | ORAL_TABLET | Freq: Every day | ORAL | Status: DC
  Administered 2017-11-14 – 2017-11-16 (×3): 0.5 mg via ORAL
  Filled 2017-11-13 (×3): qty 1

## 2017-11-13 MED ORDER — FENTANYL CITRATE (PF) 100 MCG/2ML IJ SOLN: 25.0000 ug | INTRAMUSCULAR | Status: DC | PRN

## 2017-11-13 MED ORDER — BIOTIN 1000 MCG PO TABS: 1000.0000 ug | ORAL_TABLET | Freq: Every day | ORAL | Status: DC

## 2017-11-13 MED ORDER — ACETAMINOPHEN 10 MG/ML IV SOLN
INTRAVENOUS | Status: AC
  Filled 2017-11-13: qty 100

## 2017-11-13 SURGICAL SUPPLY — 60 items
BANDAGE ELASTIC 6 LF NS (GAUZE/BANDAGES/DRESSINGS) ×3 IMPLANT
BEARING TIBIAL AS KNEE 10M 67M (Knees) ×3 IMPLANT
BIT DRILL QUICK REL 1/8 2PK SL (DRILL) ×1 IMPLANT
BLADE SAW SAG 25X90X1.19 (BLADE) ×3 IMPLANT
BLADE SURG SZ20 CARB STEEL (BLADE) ×3 IMPLANT
CANISTER SUCT 1200ML W/VALVE (MISCELLANEOUS) ×3 IMPLANT
CANISTER SUCT 3000ML PPV (MISCELLANEOUS) ×3 IMPLANT
CEMENT BONE R 1X40 (Cement) ×6 IMPLANT
CEMENT VACUUM MIXING SYSTEM (MISCELLANEOUS) ×3 IMPLANT
CHLORAPREP W/TINT 26ML (MISCELLANEOUS) ×3 IMPLANT
COOLER POLAR GLACIER W/PUMP (MISCELLANEOUS) ×3 IMPLANT
COVER MAYO STAND STRL (DRAPES) ×3 IMPLANT
CUFF TOURN 24 STER (MISCELLANEOUS) IMPLANT
CUFF TOURN 30 STER DUAL PORT (MISCELLANEOUS) ×3 IMPLANT
DRAPE IMP U-DRAPE 54X76 (DRAPES) ×3 IMPLANT
DRAPE INCISE IOBAN 66X45 STRL (DRAPES) ×3 IMPLANT
DRAPE SHEET LG 3/4 BI-LAMINATE (DRAPES) ×3 IMPLANT
DRILL QUICK RELEASE 1/8 INCH (DRILL) ×2
DRSG OPSITE POSTOP 4X10 (GAUZE/BANDAGES/DRESSINGS) ×3 IMPLANT
DRSG OPSITE POSTOP 4X8 (GAUZE/BANDAGES/DRESSINGS) ×3 IMPLANT
ELECT CAUTERY BLADE 6.4 (BLADE) ×3 IMPLANT
ELECT REM PT RETURN 9FT ADLT (ELECTROSURGICAL) ×3
ELECTRODE REM PT RTRN 9FT ADLT (ELECTROSURGICAL) ×1 IMPLANT
GLOVE BIO SURGEON STRL SZ7.5 (GLOVE) ×12 IMPLANT
GLOVE BIO SURGEON STRL SZ8 (GLOVE) ×12 IMPLANT
GLOVE BIOGEL PI IND STRL 8 (GLOVE) ×1 IMPLANT
GLOVE BIOGEL PI INDICATOR 8 (GLOVE) ×2
GLOVE INDICATOR 8.0 STRL GRN (GLOVE) ×3 IMPLANT
GOWN STRL REUS W/ TWL LRG LVL3 (GOWN DISPOSABLE) ×1 IMPLANT
GOWN STRL REUS W/ TWL XL LVL3 (GOWN DISPOSABLE) ×1 IMPLANT
GOWN STRL REUS W/TWL LRG LVL3 (GOWN DISPOSABLE) ×2
GOWN STRL REUS W/TWL XL LVL3 (GOWN DISPOSABLE) ×2
HOLDER FOLEY CATH W/STRAP (MISCELLANEOUS) ×3 IMPLANT
HOOD PEEL AWAY FLYTE STAYCOOL (MISCELLANEOUS) ×9 IMPLANT
IMMBOLIZER KNEE 19 BLUE UNIV (SOFTGOODS) ×3 IMPLANT
KIT TURNOVER KIT A (KITS) ×3 IMPLANT
KNEE CR FEMORAL RT 62.5MM (Femur) ×3 IMPLANT
NDL SAFETY ECLIPSE 18X1.5 (NEEDLE) ×2 IMPLANT
NEEDLE HYPO 18GX1.5 SHARP (NEEDLE) ×4
NEEDLE SPNL 20GX3.5 QUINCKE YW (NEEDLE) ×3 IMPLANT
NS IRRIG 1000ML POUR BTL (IV SOLUTION) ×3 IMPLANT
PACK TOTAL KNEE (MISCELLANEOUS) ×3 IMPLANT
PAD WRAPON POLAR KNEE (MISCELLANEOUS) ×1 IMPLANT
PATELLA SERIES A (Orthopedic Implant) ×3 IMPLANT
PLATE INTERLOK 6700 (Plate) ×3 IMPLANT
PULSAVAC PLUS IRRIG FAN TIP (DISPOSABLE) ×3
SOL .9 NS 3000ML IRR  AL (IV SOLUTION) ×2
SOL .9 NS 3000ML IRR UROMATIC (IV SOLUTION) ×1 IMPLANT
STAPLER SKIN PROX 35W (STAPLE) ×3 IMPLANT
SUCTION FRAZIER HANDLE 10FR (MISCELLANEOUS) ×2
SUCTION TUBE FRAZIER 10FR DISP (MISCELLANEOUS) ×1 IMPLANT
SUT VIC AB 0 CT1 36 (SUTURE) ×9 IMPLANT
SUT VIC AB 2-0 CT1 27 (SUTURE) ×8
SUT VIC AB 2-0 CT1 TAPERPNT 27 (SUTURE) ×4 IMPLANT
SYR 10ML LL (SYRINGE) ×3 IMPLANT
SYR 20CC LL (SYRINGE) ×3 IMPLANT
SYR 30ML LL (SYRINGE) ×9 IMPLANT
TIP FAN IRRIG PULSAVAC PLUS (DISPOSABLE) ×1 IMPLANT
TRAY FOLEY MTR SLVR 16FR STAT (SET/KITS/TRAYS/PACK) ×3 IMPLANT
WRAPON POLAR PAD KNEE (MISCELLANEOUS) ×3

## 2017-11-13 NOTE — Transfer of Care (Signed)
Immediate Anesthesia Transfer of Care Note  Patient: Colleen [name]  Procedure(s) Performed: TOTAL KNEE ARTHROPLASTY (Right Knee)  Patient Location: PACU  Anesthesia Type:Spinal  Level of Consciousness: sedated  Airway & Oxygen Therapy: Patient connected to face mask oxygen  Post-op Assessment: Post -op Vital signs reviewed and stable  Post vital signs: stable  Last Vitals:  Vitals Value Taken Time  BP 92/47 11/13/2017 12:49 PM  Temp 36.7 C 11/13/2017 12:49 PM  Pulse 71 11/13/2017 12:50 PM  Resp 12 11/13/2017 12:50 PM  SpO2 100 % 11/13/2017 12:50 PM  Vitals shown include unvalidated device data.  Last Pain:  Vitals:   11/13/17 1249  TempSrc: Temporal  PainSc:          Complications: No apparent anesthesia complications

## 2017-11-13 NOTE — Progress Notes (Signed)
Physical Therapy Evaluation Patient Details Name: Colleen [name] MRN: [id number] DOB: November 02, 1930 Today's Date: 11/13/2017   History of Present Illness  Patient underwent right TKR without reported post-op complications. PT evaluation performed on POD#0. PMH includes asthma, CVA, aortic valve disorder, breast cancer, GERD, hyperlipidemia and hypertension.  Clinical Impression  Pt admitted with above diagnosis. Pt currently with functional limitations due to the deficits listed below (see PT Problem List). Patient is modified independent for bed mobility. Head of bed elevated, use of bed rails, increased time required. No external assist from therapist for transfers. Cues for safe hand placement required. Patient is steady and stable once upright and standing. Decreased weight shift right lower extremity during transfer. Patient is able to take short shuffling steps to ambulate from bed to recliner with therapist. Cues and education for proper sequencing with rolling walker. No right lower extremity buckling noted during ambulation. No external support provided by therapist and node DOE reported by patient. She is able to complete all supine exercises as instructed by therapist. At this time recommend skilled nursing facility placement at discharge however will continue to monitor and update discharge recommendations as appropriate. It is very possible the patient will progress and be able to return home with home health physical therapy. Pt will benefit from PT services to address deficits in strength, balance, and mobility in order to return to full function at home.     Follow Up Recommendations SNF;Other (comment)(Will monitor and update discharge recommendations as needed.)    Equipment Recommendations  Rolling walker with 5" wheels    Recommendations for Other Services       Precautions / Restrictions Precautions Precautions: Fall Restrictions Weight Bearing Restrictions: Yes RLE Weight  Bearing: Weight bearing as tolerated      Mobility  Bed Mobility Overal bed mobility: Modified Independent             General bed mobility comments: Head of bed elevated, use of bed rails, increasrd time required. No external assist from therapist.  Transfers Overall transfer level: Needs assistance Equipment used: Rolling walker (2 wheeled) Transfers: Sit to/from Stand Sit to Stand: Min guard         General transfer comment: Cues for safe hand placement required. Patient is steady and stable once upright and standing. Decreased weight shift right lower extremity during transfer.  Ambulation/Gait Ambulation/Gait assistance: Min guard Gait Distance (Feet): 5 Feet Assistive device: Rolling walker (2 wheeled)   Gait velocity: Below functional speed for full household ambulation   General Gait Details: Patient is able to take short shuffling steps to ambulate from bed to recliner with therapist. Cues and education for proper sequencing with rolling walker. No right lower extremity buckling noted during ambulation. No external support provided by therapist and node DOE reported by patient.  Stairs            Wheelchair Mobility    Modified Rankin (Stroke Patients Only)       Balance Overall balance assessment: Needs assistance Sitting-balance support: No upper extremity supported Sitting balance-Leahy Scale: Good     Standing balance support: No upper extremity supported Standing balance-Leahy Scale: Fair Standing balance comment: Able to maintain balance in standing with minimal upper extremity support.                             Pertinent Vitals/Pain Pain Assessment: 0-10 Pain Score: 4  Pain Location: R nee Pain Descriptors / Indicators: Operative  site guarding Pain Intervention(s): Patient requesting pain meds-RN notified    Home Living Family/patient expects to be discharged to:: Private residence Living Arrangements: Alone Available  Help at Discharge: Other (Comment)(Pt states she has no one to assist her at discharge) Type of Home: House Home Access: Stairs to enter Entrance Stairs-Rails: Right Entrance Stairs-Number of Steps: 1 Home Layout: One level Home Equipment: Cane - single point(No walker)      Prior Function Level of Independence: Independent         Comments: Previously independent with all ADLs/IADLs. Ambulates intermittently with single point cane on uneven surfaces. One fall in the last 12 months due to vasovagal episode on commode.     Hand Dominance   Dominant Hand: Right    Extremity/Trunk Assessment   Upper Extremity Assessment Upper Extremity Assessment: Overall WFL for tasks assessed    Lower Extremity Assessment Lower Extremity Assessment: RLE deficits/detail RLE Deficits / Details: Patient able to perform straight leg raise and short arc quad without assistance. Reports intact sensation to light touch bilaterally. Full right ankle dorsiflexion/plantarflexion.       Communication   Communication: No difficulties  Cognition Arousal/Alertness: Awake/alert Behavior During Therapy: WFL for tasks assessed/performed Overall Cognitive Status: Within Functional Limits for tasks assessed                                        General Comments      Exercises Total Joint Exercises Ankle Circles/Pumps: Both;10 reps Quad Sets: Both;10 reps Gluteal Sets: Both;10 reps Short Arc Quad: Right;10 reps Heel Slides: Right;10 reps Hip ABduction/ADduction: Right;10 reps Straight Leg Raises: Right;10 reps Goniometric ROM: -5 to 110 degrees AAROM, pain limited   Assessment/Plan    PT Assessment Patient needs continued PT services  PT Problem List Decreased strength;Decreased range of motion;Decreased activity tolerance;Decreased balance;Decreased mobility       PT Treatment Interventions DME instruction;Gait training;Stair training;Functional mobility  training;Therapeutic activities;Balance training;Therapeutic exercise;Neuromuscular re-education;Patient/family education;Manual techniques    PT Goals (Current goals can be found in the Care Plan section)  Acute Rehab PT Goals Patient Stated Goal: "I think I really need to go to rehab, I'm worried about being alone at night." PT Goal Formulation: With patient Time For Goal Achievement: 11/27/17 Potential to Achieve Goals: Good    Frequency BID   Barriers to discharge Decreased caregiver support Patient reports that she lives alone and has no one who can assist her after discharge.    Co-evaluation               AM-PAC PT "6 Clicks" Daily Activity  Outcome Measure Difficulty turning over in bed (including adjusting bedclothes, sheets and blankets)?: A Little Difficulty moving from lying on back to sitting on the side of the bed? : A Little Difficulty sitting down on and standing up from a chair with arms (e.g., wheelchair, bedside commode, etc,.)?: A Little Help needed moving to and from a bed to chair (including a wheelchair)?: A Little Help needed walking in hospital room?: A Little Help needed climbing 3-5 steps with a railing? : A Lot 6 Click Score: 17    End of Session Equipment Utilized During Treatment: Gait belt Activity Tolerance: Patient tolerated treatment well Patient left: in chair;with call bell/phone within reach;with chair alarm set;with SCD's reapplied;Other (comment)(towel roll under heel, polarcare in place.) Nurse Communication: Mobility status PT Visit Diagnosis: Unsteadiness on feet (R26.81);Muscle  weakness (generalized) (M62.81);Pain Pain - Right/Left: Right Pain - part of body: Knee    Time: 1550-1620 PT Time Calculation (min) (ACUTE ONLY): 30 min   Charges:   PT Evaluation $PT Eval Low Complexity: 1 Low PT Treatments $Therapeutic Exercise: 8-22 mins   PT G Codes:        Jason D Huprich PT, DPT, GCS   Huprich,Jason 11/13/2017, 4:34  PM

## 2017-11-13 NOTE — H&P (Signed)
Paper H&P to be scanned into permanent record. H&P reviewed and patient re-examined. No changes. 

## 2017-11-13 NOTE — NC FL2 (Signed)
[place] LEVEL OF CARE SCREENING TOOL     IDENTIFICATION  Patient Name: Colleen [name] Birthdate: May 11, 1930 Sex: female Admission Date (Current Location): 11/13/2017  [place] and [place] Number:  Engineering geologist and Address:  [facility], [address], [place], [place] [address]      Provider Number: [id number]  Attending Physician Name and Address:  Corky Mull, MD  Relative Name and Phone Number:       Current Level of Care: Hospital Recommended Level of Care: [employer] Prior Approval Number:    Date Approved/Denied:   PASRR Number: [id number])  Discharge Plan: SNF    Current Diagnoses: Patient Active Problem List   Diagnosis Date Noted  . Status post total knee replacement using cement, right 11/13/2017    Orientation RESPIRATION BLADDER Height & Weight     Self, Time, Situation, Place  Normal Continent Weight: 154 lb (69.9 kg) Height:  5\' 1"  (154.9 cm)  BEHAVIORAL SYMPTOMS/MOOD NEUROLOGICAL BOWEL NUTRITION STATUS      Continent Diet(Diet: Clear Liquid to be Advanced. )  AMBULATORY STATUS COMMUNICATION OF NEEDS Skin   Extensive Assist Verbally Surgical wounds(Incision: Right Knee. )                       Personal Care Assistance Level of Assistance  Bathing, Feeding, Dressing Bathing Assistance: Limited assistance Feeding assistance: Independent Dressing Assistance: Limited assistance     Functional Limitations Info  Sight, Hearing, Speech Sight Info: Adequate Hearing Info: Adequate Speech Info: Adequate    SPECIAL CARE FACTORS FREQUENCY  PT (By licensed PT), OT (By licensed OT)     PT Frequency: (5) OT Frequency: (5)            Contractures      Additional Factors Info  Code Status, Allergies Code Status Info: (Full Code. ) Allergies Info: (Ibuprofen, Codeine)           Current Medications (11/13/2017):  This is the current hospital active medication list Current  Facility-Administered Medications  Medication Dose Route Frequency Provider Last Rate Last Dose  . acetaminophen (TYLENOL) tablet 1,000 mg  1,000 mg Oral Q6H Poggi, Marshall Cork, MD      . Derrill Memo ON 11/14/2017] acetaminophen (TYLENOL) tablet 325-650 mg  325-650 mg Oral Q6H PRN Poggi, Marshall Cork, MD      . atenolol (TENORMIN) tablet 50 mg  50 mg Oral BID Poggi, Marshall Cork, MD      . B-complex with vitamin C tablet 1 tablet  1 tablet Oral Daily Poggi, Marshall Cork, MD      . bisacodyl (DULCOLAX) suppository 10 mg  10 mg Rectal Daily PRN Poggi, Marshall Cork, MD      . Derrill Memo ON 11/14/2017] calcium-vitamin D 500-200 MG-UNIT per tablet 1 tablet  1 tablet Oral Daily Poggi, Marshall Cork, MD      . ceFAZolin (ANCEF) IVPB 2g/100 mL premix  2 g Intravenous Q6H Poggi, Marshall Cork, MD      . Derrill Memo ON 11/14/2017] cholecalciferol (VITAMIN D) tablet 400 Units  400 Units Oral Daily Poggi, Marshall Cork, MD      . Derrill Memo ON 11/14/2017] dabigatran (PRADAXA) capsule 150 mg  150 mg Oral BID Poggi, Marshall Cork, MD      . diphenhydrAMINE (BENADRYL) 12.5 MG/5ML elixir 12.5-25 mg  12.5-25 mg Oral Q4H PRN Poggi, Marshall Cork, MD      . docusate sodium (COLACE) capsule 100 mg  100 mg  Oral BID Poggi, Marshall Cork, MD      . Derrill Memo ON 11/14/2017] fluticasone (FLONASE) 50 MCG/ACT nasal spray 2 spray  2 spray Each Nare Daily Poggi, Marshall Cork, MD      . Derrill Memo ON 12/08/2120] folic acid (FOLVITE) tablet 0.5 mg  500 mcg Oral Daily Poggi, Marshall Cork, MD      . furosemide (LASIX) tablet 20 mg  20 mg Oral Daily Poggi, Marshall Cork, MD      . HYDROmorphone (DILAUDID) injection 0.5-1 mg  0.5-1 mg Intravenous Q4H PRN Poggi, Marshall Cork, MD      . lisinopril (PRINIVIL,ZESTRIL) tablet 40 mg  40 mg Oral BID Poggi, Marshall Cork, MD      . Derrill Memo ON 11/14/2017] loratadine (CLARITIN) tablet 10 mg  10 mg Oral Daily Poggi, Marshall Cork, MD      . magnesium hydroxide (MILK OF MAGNESIA) suspension 30 mL  30 mL Oral Daily PRN Poggi, Marshall Cork, MD      . metoCLOPramide (REGLAN) tablet 5-10 mg  5-10 mg Oral Q8H PRN Poggi, Marshall Cork, MD       Or   . metoCLOPramide (REGLAN) injection 5-10 mg  5-10 mg Intravenous Q8H PRN Poggi, Marshall Cork, MD      . montelukast (SINGULAIR) tablet 10 mg  10 mg Oral QHS Poggi, Marshall Cork, MD      . Derrill Memo ON 11/14/2017] multivitamin with minerals tablet 1 tablet  1 tablet Oral Daily Poggi, Marshall Cork, MD      . Derrill Memo ON 11/14/2017] NIFEdipine (PROCARDIA-XL/ADALAT-CC/NIFEDICAL-XL) 24 hr tablet 30 mg  30 mg Oral Daily Poggi, Marshall Cork, MD      . Derrill Memo ON 11/14/2017] omega-3 acid ethyl esters (LOVAZA) capsule 1 g  1 g Oral Daily Poggi, Marshall Cork, MD      . ondansetron (ZOFRAN) tablet 4 mg  4 mg Oral Q6H PRN Poggi, Marshall Cork, MD       Or  . ondansetron (ZOFRAN) injection 4 mg  4 mg Intravenous Q6H PRN Poggi, Marshall Cork, MD      . oxyCODONE (Oxy IR/ROXICODONE) immediate release tablet 5-10 mg  5-10 mg Oral Q4H PRN Poggi, Marshall Cork, MD      . pantoprazole (PROTONIX) EC tablet 40 mg  40 mg Oral Daily Poggi, Marshall Cork, MD      . rosuvastatin (CRESTOR) tablet 10 mg  10 mg Oral QHS Poggi, Marshall Cork, MD      . sodium phosphate (FLEET) 7-19 GM/118ML enema 1 enema  1 enema Rectal Once PRN Poggi, Marshall Cork, MD      . spironolactone (ALDACTONE) tablet 25 mg  25 mg Oral Daily Poggi, Marshall Cork, MD      . traMADol Veatrice Bourbon) tablet 50 mg  50 mg Oral Q6H Poggi, Marshall Cork, MD      . Derrill Memo ON 11/14/2017] vitamin C (ASCORBIC ACID) tablet 1,000 mg  1,000 mg Oral Daily Poggi, Marshall Cork, MD      . Derrill Memo ON 11/14/2017] vitamin E capsule 400 Units  400 Units Oral Daily Poggi, Marshall Cork, MD         Discharge Medications: Please see discharge summary for a list of discharge medications.  Relevant Imaging Results:  Relevant Lab Results:   Additional Information (SSN: [id number])  Servando Kyllonen, Veronia Beets, LCSW

## 2017-11-13 NOTE — Anesthesia Procedure Notes (Addendum)
Spinal  Patient location during procedure: OR Start time: 11/13/2017 10:33 AM End time: 11/13/2017 10:33 AM Staffing Anesthesiologist: Piscitello, Precious Haws, MD Resident/CRNA: Aline Brochure, CRNA Other anesthesia staff: Wess Botts, RN Performed: other anesthesia staff  Preanesthetic Checklist Completed: patient identified, site marked, surgical consent, pre-op evaluation, timeout performed, IV checked, risks and benefits discussed and monitors and equipment checked Spinal Block Patient position: sitting Prep: ChloraPrep Patient monitoring: heart rate, continuous pulse ox and blood pressure Approach: midline Location: L3-4 Injection technique: single-shot Needle Needle type: Pencan  Needle gauge: 24 G Assessment Sensory level: T6

## 2017-11-13 NOTE — Progress Notes (Signed)
Poggi paged r/t fluids. New order for NS at 77 and to remove no later than PODx1, may leave until am PODx1 if patient agrees, may remove sooner.

## 2017-11-13 NOTE — Anesthesia Preprocedure Evaluation (Signed)
Anesthesia Evaluation  Patient identified by MRN, date of birth, ID band Patient awake    Reviewed: Allergy & Precautions, H&P , NPO status , Patient's Chart, lab work & pertinent test results  History of Anesthesia Complications Negative for: history of anesthetic complications  Airway Mallampati: III  TM Distance: <3 FB Neck ROM: limited    Dental  (+) Chipped, Poor Dentition, Missing, Loose   Pulmonary neg shortness of breath, asthma , former smoker,           Cardiovascular Exercise Tolerance: Good hypertension, (-) angina(-) Past MI and (-) DOE      Neuro/Psych CVA, Residual Symptoms negative psych ROS   GI/Hepatic Neg liver ROS, GERD  Medicated and Controlled,  Endo/Other  negative endocrine ROS  Renal/GU      Musculoskeletal  (+) Arthritis ,   Abdominal   Peds  Hematology negative hematology ROS (+)   Anesthesia Other Findings Past Medical History: No date: Aortic valve disorder No date: Arthritis No date: Asthma No date: Atrial fibrillation [facility]) 2004: Breast cancer ([place])     Comment:  LT MASTECTOMY No date: Fibrocystic disease of both breasts No date: GERD (gastroesophageal reflux disease) No date: Hx of colonic polyps No date: Hyperlipidemia No date: Hypertension No date: Malignant neoplasm of breast ([facility])     Comment:  left 2019: Osteoarthritis of right knee 2008: Stroke [facility])     Comment:  right peripheral vision is gone  Past Surgical History: 1968: ABDOMINAL HYSTERECTOMY No date: APPENDECTOMY 1986: BREAST EXCISIONAL BIOPSY; Right     Comment:  NEG No date: CATARACT EXTRACTION W/ INTRAOCULAR LENS  IMPLANT, BILATERAL 2000, 2003, 2008, 2013: COLONOSCOPY 1973: deviated septum repair 2013: ESOPHAGOGASTRODUODENOSCOPY No date: EYE SURGERY 2004: MASTECTOMY; Left     Comment:  BREAST CA No date: TONSILLECTOMY  BMI    Body Mass Index:  29.10 kg/m      Reproductive/Obstetrics negative  OB ROS                             Anesthesia Physical Anesthesia Plan  ASA: III  Anesthesia Plan: Spinal   Post-op Pain Management:    Induction:   PONV Risk Score and Plan:   Airway Management Planned: Natural Airway and Nasal Cannula  Additional Equipment:   Intra-op Plan:   Post-operative Plan:   Informed Consent: I have reviewed the patients History and Physical, chart, labs and discussed the procedure including the risks, benefits and alternatives for the proposed anesthesia with the patient or authorized representative who has indicated his/her understanding and acceptance.   Dental Advisory Given  Plan Discussed with: Anesthesiologist, CRNA and Surgeon  Anesthesia Plan Comments: (Patient reports no bleeding problems and no anticoagulant use.  Plan for spinal with backup GA  Patient consented for risks of anesthesia including but not limited to:  - adverse reactions to medications - risk of bleeding, infection, nerve damage and headache - risk of failed spinal - damage to teeth, lips or other oral mucosa - sore throat or hoarseness - Damage to heart, brain, lungs or loss of life  Patient voiced understanding.)        Anesthesia Quick Evaluation

## 2017-11-13 NOTE — Anesthesia Post-op Follow-up Note (Signed)
Anesthesia QCDR form completed.        

## 2017-11-13 NOTE — Progress Notes (Signed)
ADMISSION NOTE:  Pt admitted to room 152 from PACU. Pt alert and oriented X4. No complaints of pain. Surgical dressing clean dry and intact. Foot pumps and  TEDS on. VSS. Bed in lowest position call bell in reach and bed alarm on.

## 2017-11-13 NOTE — Op Note (Signed)
11/13/2017  12:44 PM  Patient:   Colleen [name]  Pre-Op Diagnosis:   Degenerative joint disease, right knee.  Post-Op Diagnosis:   Same  Procedure:   Right TKA using all-cemented [employer] Vanguard system with a 62.5 mm PCR femur, a 67 mm tibial tray with a 10 mm AS E-poly insert, and a 31 x 8 mm all-poly 3-pegged domed patella.  Surgeon:   Pascal Lux, MD  Assistant:   Cameron Proud, PA-C   Anesthesia:   Spinal  Findings:   As above  Complications:   None  EBL:   10 cc  Fluids:   1000 cc crystalloid  UOP:   50 cc  TT:   86 minutes at 300 mmHg  Drains:   None  Closure:   Staples  Implants:   As above  Brief Clinical Note:   The patient is an 84 year old female with a long history of progressively worsening right knee pain. The patient's symptoms have progressed despite medications, activity modification, injections, etc. The patient's history and examination were consistent with advanced degenerative joint disease of the right knee confirmed by plain radiographs. The patient presents at this time for a right total knee arthroplasty.  Procedure:   The patient was brought into the operating room. After adequate spinal anesthesia was obtained, the patient was lain in the supine position. A Foley catheter was placed by the nurse before the right lower extremity was prepped with ChloraPrep solution and draped sterilely. Preoperative antibiotics were administered. After verifying the proper laterality with a surgical timeout, the limb was exsanguinated with an Esmarch and the tourniquet inflated to 300 mmHg. A standard anterior approach to the knee was made through an approximately 7 inch incision. The incision was carried down through the subcutaneous tissues to expose superficial retinaculum. This was split the length of the incision and the medial flap elevated sufficiently to expose the medial retinaculum. The medial retinaculum was incised, leaving a 3-4 mm cuff of tissue on the  patella. This was extended distally along the medial border of the patellar tendon and proximally through the medial third of the quadriceps tendon. A subtotal fat pad excision was performed before the soft tissues were elevated off the anteromedial and anterolateral aspects of the proximal tibia to the level of the collateral ligaments. The anterior portions of the medial and lateral menisci were removed, as was the anterior cruciate ligament. With the knee flexed to 90, the external tibial guide was positioned and the appropriate proximal tibial cut made. This piece was taken to the back table where it was measured and found to be optimally replicated by a 67 mm component.  Attention was directed to the distal femur. The intramedullary canal was accessed through a 3/8" drill hole. The intramedullary guide was inserted and position in order to obtain a neutral flexion gap. The intercondylar block was positioned with care taken to avoid notching the anterior cortex of the femur. The appropriate cut was made. Next, the distal cutting block was placed at 6 of valgus alignment. Using the 9 mm slot, the distal cut was made. The distal femur was measured and found to be optimally replicated by the 74.8 mm component. The 62.5 mm 4-in-1 cutting block was positioned and first the posterior, then the posterior chamfer, the anterior chamfer, femoral and intercondylar cuts were made. At this point, the posterior portions medial and lateral menisci were removed. A trial reduction was performed using the appropriate femoral and tibial components with the 10  mm insert. This demonstrated excellent stability to varus and valgus stressing both in flexion and extension while permitting full extension. Patella tracking was assessed and found to be excellent. Therefore, the tibial guide position was marked on the proximal tibia. The patella thickness was measured and found to be 20 mm. Therefore, the appropriate cut was made. The  patellar surface was measured and found to be optimally replicated by the 31 mm component. The three peg holes were drilled in place before the trial button was inserted. Patella tracking was assessed and found to be excellent, passing the "no thumb test". The lug holes were drilled into the distal femur before the trial component was removed, leaving only the tibial tray. The keel was then created using the appropriate tower, reamer, and punch.  The bony surfaces were prepared for cementing by irrigating thoroughly with bacitracin saline solution. A bone plug was fashioned from some of the bone that had been removed previously and used to plug the distal femoral canal. In addition, 20 cc of Exparel diluted out to 60 cc with normal saline and 30 cc of 0.5% Sensorcaine were injected into the postero-medial and postero-lateral aspects of the knee, the medial and lateral gutter regions, and the peri-incisional tissues to help with postoperative analgesia. Meanwhile, the cement was being mixed on the back table. When it was ready, the tibial tray was cemented in first. The excess cement was removed using [employer] fast streamer. Next, the femoral component was impacted into place. Again, the excess cement was removed using [employer] fast streamer. The 10 mm trial insert was positioned and the knee brought into extension while the cement hardened. Finally, the patella was cemented into place and secured using the patellar clamp. Again, the excess cement was removed using [employer] fast streamer. Once the cement had hardened, the knee was placed through a range of motion with the findings as described above.  There was mild anterior-posterior laxity in mid flexion, but there was no varus or valgus instability. Therefore, the trial insert was removed and, after verifying that no cement had been retained posteriorly, the permanent 10 mm anterior stabilized E-polyethylene insert was positioned and secured using the appropriate key locking  mechanism. Again the knee was placed through a range of motion with the findings as described above.  The wound was copiously irrigated with bacitracin saline solution using the jet lavage system before the quadriceps tendon and retinacular layer were reapproximated using #0 Vicryl interrupted sutures. The superficial retinacular layer also was closed using a running #0 Vicryl suture. A total of 10 cc of transexemic acid (TXA) was injected intra-articularly before the subcutaneous tissues were closed in several layers using 2-0 Vicryl interrupted sutures. The skin was closed using staples. A sterile honeycomb dressing was applied to the skin before the leg was wrapped with an Ace wrap to accommodate the Polar Care device. The patient was then awakened and returned to the recovery room in satisfactory condition after tolerating the procedure well.

## 2017-11-14 ENCOUNTER — Encounter: Payer: Self-pay | Admitting: Surgery

## 2017-11-14 ENCOUNTER — Encounter
Admission: RE | Admit: 2017-11-14 | Discharge: 2017-11-14 | Disposition: A | Discharge status: Home / Self Care | Payer: Medicare Other | Source: Ambulatory Visit | Attending: Internal Medicine | Admitting: Internal Medicine

## 2017-11-14 LAB — CBC WITH DIFFERENTIAL/PLATELET
Basophils Absolute: 0 10*3/uL (ref 0–0.1)
Basophils Relative: 0 %
EOS ABS: 0 10*3/uL (ref 0–0.7)
Eosinophils Relative: 0 %
HEMATOCRIT: 36 % (ref 35.0–47.0)
HEMOGLOBIN: 12.3 g/dL (ref 12.0–16.0)
LYMPHS ABS: 0.8 10*3/uL — AB (ref 1.0–3.6)
Lymphocytes Relative: 7 %
MCH: 32 pg (ref 26.0–34.0)
MCHC: 34.3 g/dL (ref 32.0–36.0)
MCV: 93.4 fL (ref 80.0–100.0)
MONOS PCT: 6 %
Monocytes Absolute: 0.7 10*3/uL (ref 0.2–0.9)
NEUTROS ABS: 10.1 10*3/uL — AB (ref 1.4–6.5)
NEUTROS PCT: 87 %
Platelets: 169 10*3/uL (ref 150–440)
RBC: 3.85 MIL/uL (ref 3.80–5.20)
RDW: 12.9 % (ref 11.5–14.5)
WBC: 11.5 10*3/uL — AB (ref 3.6–11.0)

## 2017-11-14 LAB — BASIC METABOLIC PANEL
Anion gap: 8 (ref 5–15)
BUN: 10 mg/dL (ref 8–23)
CHLORIDE: 108 mmol/L (ref 98–111)
CO2: 25 mmol/L (ref 22–32)
CREATININE: 1.04 mg/dL — AB (ref 0.44–1.00)
Calcium: 8.9 mg/dL (ref 8.9–10.3)
GFR calc non Af Amer: 48 mL/min — ABNORMAL LOW (ref >60)
GFR, EST AFRICAN AMERICAN: 56 mL/min — AB (ref >60)
Glucose, Bld: 132 mg/dL — ABNORMAL HIGH (ref 70–99)
POTASSIUM: 3.9 mmol/L (ref 3.5–5.1)
SODIUM: 141 mmol/L (ref 135–145)

## 2017-11-14 NOTE — Progress Notes (Signed)
Physical Therapy Treatment Patient Details Name: Colleen [name] MRN: [id number] DOB: 1930-05-14 Today's Date: 11/14/2017    History of Present Illness Patient underwent right TKR without reported post-op complications. PT evaluation performed on POD#0. PMH includes asthma, CVA, aortic valve disorder, breast cancer, GERD, hyperlipidemia and hypertension.    PT Comments    Patient continues making good progress with therapy during p.m. Session. Patient still requires cues for safe hand placement during transfers.  She continues to demonstrate improving speed and sequencing with transfers.  She is able to complete a full lap around the nurse station with therapist as well as walk into rehab gym to practice steps. Therapist again provided cues to increase step length bilaterally and progress walker in smooth fashion.  No external support required from therapist. Patient is steady and standing during ambulation.  Patient denies dyspnea on exertion.  She is able to increase speed gradually throughout distance. Patient is able to safely ascend/descend 4 steps with bilateral rails and step to pattern.  No loss of balance or instability noted.  Patient is also able to perform 1 step backwards with walker to simulate her home environment.  At this point patient has met all PT goals for discharge.  She is safe to return home with home health physical therapy and intermittent assist from family/friends for IADLs when medically appropriate. Pt will benefit from PT services to address deficits in strength, balance, and mobility in order to return to full function at home.    Follow Up Recommendations  Home health PT;Supervision - Intermittent     Equipment Recommendations  Rolling walker with 5" wheels;3in1 (PT)    Recommendations for Other Services       Precautions / Restrictions Precautions Precautions: Fall Restrictions Weight Bearing Restrictions: Yes RLE Weight Bearing: Weight bearing as  tolerated    Mobility  Bed Mobility               General bed mobility comments: Received in left upright in recliner.  Transfers Overall transfer level: Needs assistance Equipment used: Rolling walker (2 wheeled) Transfers: Sit to/from Stand Sit to Stand: Min guard         General transfer comment: Patient still requires cues for safe hand placement.  She continues to demonstrate improving speed and sequencing with transfers.   Ambulation/Gait Ambulation/Gait assistance: Min guard Gait Distance (Feet): 280 Feet Assistive device: Rolling walker (2 wheeled)   Gait velocity: WFL for very limited household distances   General Gait Details: Patient is able to complete a full lap around the nurse station with therapist as well as walk into rehab gym to practice steps. Therapist again provided cues to increase step length bilaterally and progress walker in smooth fashion.  No external support required from therapist, patient is steady and standing during ambulation.  Patient denies dyspnea on exertion.  She is able to increase speed gradually throughout distance.   Stairs Stairs: Yes Stairs assistance: Min guard Stair Management: Two rails;Step to pattern Number of Stairs: 4 General stair comments: Patient is able to safely ascend/descend 4 steps with bilateral rails and step to pattern.  No loss of balance or instability noted.  Patient is also able to perform 1 step backwards with walker to simulate her home environment.   Wheelchair Mobility    Modified Rankin (Stroke Patients Only)       Balance Overall balance assessment: Needs assistance Sitting-balance support: No upper extremity supported Sitting balance-Leahy Scale: Good     Standing balance support:  No upper extremity supported Standing balance-Leahy Scale: Fair Standing balance comment: Able to maintain balance in standing with minimal upper extremity support.                             Cognition Arousal/Alertness: Awake/alert Behavior During Therapy: WFL for tasks assessed/performed Overall Cognitive Status: Within Functional Limits for tasks assessed                                        Exercises Total Joint Exercises Ankle Circles/Pumps: Both;15 reps;Other reps (comment)(Heel raises) Hip ABduction/ADduction: Both;15 reps Long Arc Quad: Right;15 reps Knee Flexion: Right;15 reps Marching in Standing: Both;15 reps    General Comments        Pertinent Vitals/Pain Pain Assessment: 0-10 Pain Score: 1  Pain Location: R knee Pain Descriptors / Indicators: Operative site guarding Pain Intervention(s): Premedicated before session    Home Living                      Prior Function            PT Goals (current goals can now be found in the care plan section) Acute Rehab PT Goals Patient Stated Goal: "I think I really need to go to rehab, I'm worried about being alone at night." PT Goal Formulation: With patient Time For Goal Achievement: 11/27/17 Potential to Achieve Goals: Good Progress towards PT goals: Progressing toward goals    Frequency    BID      PT Plan Discharge plan needs to be updated    Co-evaluation              AM-PAC PT "6 Clicks" Daily Activity  Outcome Measure  Difficulty turning over in bed (including adjusting bedclothes, sheets and blankets)?: A Little Difficulty moving from lying on back to sitting on the side of the bed? : A Little Difficulty sitting down on and standing up from a chair with arms (e.g., wheelchair, bedside commode, etc,.)?: A Little Help needed moving to and from a bed to chair (including a wheelchair)?: None Help needed walking in hospital room?: None Help needed climbing 3-5 steps with a railing? : A Lot 6 Click Score: 19    End of Session Equipment Utilized During Treatment: Gait belt Activity Tolerance: Patient tolerated treatment well Patient left: in chair;with  call bell/phone within reach;with chair alarm set;Other (comment);with SCD's reapplied(towel roll under heel, polarcare in place.)   PT Visit Diagnosis: Unsteadiness on feet (R26.81);Muscle weakness (generalized) (M62.81);Pain Pain - Right/Left: Right Pain - part of body: Knee     Time: 1345-1415 PT Time Calculation (min) (ACUTE ONLY): 30 min  Charges:  $Gait Training: 8-22 mins $Therapeutic Exercise: 8-22 mins                    G Codes:       Lyndel Safe Jaylise Peek PT, DPT, GCS    [place] 11/14/2017, 2:43 PM

## 2017-11-14 NOTE — Progress Notes (Signed)
Physical Therapy Treatment Patient Details Name: Colleen [name] MRN: [id number] DOB: 10-01-1930 Today's Date: 11/14/2017    History of Present Illness Patient underwent right TKR without reported post-op complications. PT evaluation performed on POD#0. PMH includes asthma, CVA, aortic valve disorder, breast cancer, GERD, hyperlipidemia and hypertension.    PT Comments    Patient is making excellent progress with therapy.  Patient again requires cues for safe hand placement. However she is steady and stable once upright in standing. Improving weight shift to right lower extremity during transfers. Patient is able to complete a full lap around the nurse station with therapist. Therapist advised patient that she can ambulate whatever distance she feels she is able and pt elects to complete a lap around the nurse station. Gait is slow but patient is steady and does not require any external support. Patient denies DOE and no signs of respiratory distress. She is able to complete all supine exercises as instructed with therapist. Will continue to progress ambulation distance during PM session and attempt stairs if appropriate. Discharge plan updated to home health PT with intermittent supervision for IADLs during her recovery. Recommend a rolling walker and bedside commode at discharge. Pt will benefit from PT services to address deficits in strength, balance, and mobility in order to return to full function at home.    Follow Up Recommendations  Home health PT;Supervision - Intermittent     Equipment Recommendations  Rolling walker with 5" wheels;3in1 (PT)    Recommendations for Other Services       Precautions / Restrictions Precautions Precautions: Fall Restrictions Weight Bearing Restrictions: Yes RLE Weight Bearing: Weight bearing as tolerated    Mobility  Bed Mobility Overal bed mobility: Modified Independent             General bed mobility comments: Head of bed elevated, use  of bed rails, increasrd time required but improved from prior session. No external assist from therapist.  Transfers Overall transfer level: Needs assistance Equipment used: Rolling walker (2 wheeled) Transfers: Sit to/from Stand Sit to Stand: Min guard         General transfer comment: Patient again requires cues for safe hand placement. However she is steady and stable once upright in standing. Improving weight shift right lower extremity during transfer.  Ambulation/Gait Ambulation/Gait assistance: Min guard Gait Distance (Feet): 200 Feet Assistive device: Rolling walker (2 wheeled)   Gait velocity: WFL for very limited household distances   General Gait Details: Patient is able to complete a full lap around the nurse station with therapist. Therapist advised patient that she can ambulate whatever distance she feels she is able and pt elects to complete a lap around the nurse station. Gait is slow but patient is steady and does not require any external support. Patient denies DOE and no signs of respiratory distress.   Stairs             Wheelchair Mobility    Modified Rankin (Stroke Patients Only)       Balance Overall balance assessment: Needs assistance Sitting-balance support: No upper extremity supported Sitting balance-Leahy Scale: Good     Standing balance support: No upper extremity supported Standing balance-Leahy Scale: Fair Standing balance comment: Able to maintain balance in standing with minimal upper extremity support.                            Cognition Arousal/Alertness: Awake/alert Behavior During Therapy: WFL for tasks assessed/performed Overall  Cognitive Status: Within Functional Limits for tasks assessed                                        Exercises Total Joint Exercises Ankle Circles/Pumps: Both;15 reps Quad Sets: Both;15 reps Gluteal Sets: Both;15 reps Heel Slides: Right;15 reps Hip  ABduction/ADduction: Right;15 reps Straight Leg Raises: Right;15 reps Long Arc Quad: Right;15 reps;Seated Goniometric ROM: -6 to 95 degrees AAROM, pain limited for both flexion and extension    General Comments        Pertinent Vitals/Pain Pain Assessment: 0-10 Pain Score: 5  Pain Location: R knee Pain Descriptors / Indicators: Operative site guarding Pain Intervention(s): Monitored during session;Premedicated before session    Home Living                      Prior Function            PT Goals (current goals can now be found in the care plan section) Acute Rehab PT Goals Patient Stated Goal: "I think I really need to go to rehab, I'm worried about being alone at night." PT Goal Formulation: With patient Time For Goal Achievement: 11/27/17 Potential to Achieve Goals: Good Progress towards PT goals: Progressing toward goals    Frequency    BID      PT Plan Discharge plan needs to be updated    Co-evaluation              AM-PAC PT "6 Clicks" Daily Activity  Outcome Measure  Difficulty turning over in bed (including adjusting bedclothes, sheets and blankets)?: A Little Difficulty moving from lying on back to sitting on the side of the bed? : A Little Difficulty sitting down on and standing up from a chair with arms (e.g., wheelchair, bedside commode, etc,.)?: A Little Help needed moving to and from a bed to chair (including a wheelchair)?: None Help needed walking in hospital room?: A Little Help needed climbing 3-5 steps with a railing? : A Lot 6 Click Score: 18    End of Session Equipment Utilized During Treatment: Gait belt Activity Tolerance: Patient tolerated treatment well Patient left: in chair;with call bell/phone within reach;with chair alarm set;Other (comment)(towel roll under heel, polarcare in place.) Nurse Communication: Mobility status PT Visit Diagnosis: Unsteadiness on feet (R26.81);Muscle weakness (generalized)  (M62.81);Pain Pain - Right/Left: Right Pain - part of body: Knee     Time: 8675-4492 PT Time Calculation (min) (ACUTE ONLY): 31 min  Charges:  $Gait Training: 8-22 mins $Therapeutic Exercise: 8-22 mins                    G Codes:       Onyekachi Gathright D Tavonte Seybold PT, DPT, GCS    Zollie Clemence 11/14/2017, 10:31 AM

## 2017-11-14 NOTE — Anesthesia Postprocedure Evaluation (Signed)
Anesthesia Post Note  Patient: Colleen [name]  Procedure(s) Performed: TOTAL KNEE ARTHROPLASTY (Right Knee)  Patient location during evaluation: Other Anesthesia Type: Spinal Level of consciousness: awake and alert Pain management: pain level controlled Vital Signs Assessment: post-procedure vital signs reviewed and stable Respiratory status: spontaneous breathing and respiratory function stable Cardiovascular status: blood pressure returned to baseline and stable Postop Assessment: spinal receding Anesthetic complications: no     Last Vitals:  Vitals:   11/14/17 0431 11/14/17 0748  BP: (!) 115/54 (!) 122/58  Pulse: 77 76  Resp:  18  Temp: 36.8 C (!) 36.3 C  SpO2: 94% 91%    Last Pain:  Vitals:   11/14/17 0810  TempSrc:   PainSc: 5                  Alison Stalling

## 2017-11-14 NOTE — Clinical Social Work Placement (Signed)
   CLINICAL SOCIAL WORK PLACEMENT  NOTE  Date:  11/14/2017  Patient Details  Name: Colleen [name] MRN: [id number] Date of Birth: 17-May-1930  Clinical Social Work is seeking post-discharge placement for this patient at the [place] level of care (*CSW will initial, date and re-position this form in  chart as items are completed):  Yes   Patient/family provided with [place] Work Department's list of facilities offering this level of care within the geographic area requested by the patient (or if unable, by the patient's family).  Yes   Patient/family informed of their freedom to choose among providers that offer the needed level of care, that participate in Medicare, Medicaid or managed care program needed by the patient, have an available bed and are willing to accept the patient.  Yes   Patient/family informed of [facility]'s ownership interest in [facility] and [facility], as well as of the fact that they are under no obligation to receive care at these facilities.  PASRR submitted to EDS on 11/13/17     PASRR number received on 11/13/17     Existing PASRR number confirmed on       FL2 transmitted to all facilities in geographic area requested by pt/family on 11/14/17     FL2 transmitted to all facilities within larger geographic area on       Patient informed that his/her managed care company has contracts with or will negotiate with certain facilities, including the following:        Yes   Patient/family informed of bed offers received.  Patient chooses bed at [facility] )     Physician recommends and patient chooses bed at      Patient to be transferred to   on  .  Patient to be transferred to facility by       Patient family notified on   of transfer.  Name of family member notified:        PHYSICIAN       Additional Comment:    _______________________________________________ Sagar Tengan, Veronia Beets, LCSW 11/14/2017,  2:40 PM

## 2017-11-14 NOTE — Progress Notes (Addendum)
  Subjective: 1 Day Post-Op Procedure(s) (LRB): TOTAL KNEE ARTHROPLASTY (Right) Patient reports pain as mild.   Patient is well, and has had no acute complaints or problems PT and Care management to assist with discharge planning, current plan is for discharge to SNF. Negative for chest pain and shortness of breath Fever: no Gastrointestinal:Negative for nausea and vomiting  Objective: Vital signs in last 24 hours: Temp:  [96.4 F (35.8 C)-98.3 F (36.8 C)] 97.4 F (36.3 C) (07/10 0748) Pulse Rate:  [58-100] 76 (07/10 0748) Resp:  [12-19] 18 (07/10 0748) BP: (92-133)/(47-89) 122/58 (07/10 0748) SpO2:  [90 %-100 %] 91 % (07/10 0748) Weight:  [69.9 kg (154 lb)] 69.9 kg (154 lb) (07/09 1406)  Intake/Output from previous day:  Intake/Output Summary (Last 24 hours) at 11/14/2017 0751 Last data filed at 11/14/2017 0530 Gross per 24 hour  Intake 1286.67 ml  Output 1910 ml  Net -623.33 ml    Intake/Output this shift: No intake/output data recorded.  Labs: Recent Labs    11/14/17 0526  HGB 12.3   Recent Labs    11/14/17 0526  WBC 11.5*  RBC 3.85  HCT 36.0  PLT 169   Recent Labs    11/14/17 0526  NA 141  K 3.9  CL 108  CO2 25  BUN 10  CREATININE 1.04*  GLUCOSE 132*  CALCIUM 8.9   Recent Labs    11/13/17 0853  INR 1.00     EXAM General - Patient is Alert, Appropriate and Oriented Extremity - ABD soft Neurovascular intact Sensation intact distally Intact pulses distally Dorsiflexion/Plantar flexion intact Incision: dressing C/D/I No cellulitis present Dressing/Incision - Clean, dry without drainage this AM. Motor Function - intact, moving foot and toes well on exam.   Past Medical History:  Diagnosis Date  . Aortic valve disorder   . Arthritis   . Asthma   . Atrial fibrillation ([place])   . Breast cancer ([place]) 2004   LT MASTECTOMY  . Fibrocystic disease of both breasts   . GERD (gastroesophageal reflux disease)   . Hx of colonic polyps   .  Hyperlipidemia   . Hypertension   . Malignant neoplasm of breast ([place])    left  . Osteoarthritis of right knee 2019  . Stroke [facility]) 2008   right peripheral vision is gone    Assessment/Plan: 1 Day Post-Op Procedure(s) (LRB): TOTAL KNEE ARTHROPLASTY (Right) Active Problems:   Status post total knee replacement using cement, right  Estimated body mass index is 29.1 kg/m as calculated from the following:   Height as of this encounter: 5\' 1"  (1.549 m).   Weight as of this encounter: 69.9 kg (154 lb). Advance diet Up with therapy   Can wean off of IVF as oral intake increases. Labs reviewed this morning.  CBC and BMP ordered for tomorrow morning. Up with therapy today. Continue working on having a BM.  Work on urination today. Plan at this time will be for discharge to SNF when medically appropriate.  DVT Prophylaxis - Foot Pumps, TED hose and Pradaxa Weight-Bearing as tolerated to right leg  J. Cameron Proud, PA-C [facility] Orthopaedic Surgery 11/14/2017, 7:51 AM

## 2017-11-14 NOTE — Clinical Social Work Note (Signed)
Clinical Social Work Assessment  Patient Details  Name: Colleen [name] MRN: [id number] Date of Birth: Dec 22, 1930  Date of referral:  11/14/17               Reason for consult:  Facility Placement                Permission sought to share information with:  Chartered certified accountant granted to share information::  Yes, Verbal Permission Granted  Name::      [place]::   [place]   Relationship::     Contact Information:     Housing/Transportation Living arrangements for the past 2 months:  [place] of Information:  Patient Patient Interpreter Needed:  None Criminal Activity/Legal Involvement Pertinent to Current Situation/Hospitalization:  No - Comment as needed Significant Relationships:  Neighbor Lives with:  Self Do you feel safe going back to the place where you live?  Yes Need for family participation in patient care:  Yes (Comment)  Care giving concerns:  Patient lives alone in [place].    Social Worker assessment / plan:  Holiday representative (CSW) received SNF consult. PT recommended SNF on post op day 0. CSW met with patient alone at bedside to discuss D/C plan. Patient was alert and oriented X4 and was sitting up in the bed. CSW introduced self and explained role of CSW department. Per patient she lives alone in [place] and wants to go to [facility]. CSW explained that medicare requires a 3 night qualfying inpatient stay in a hospital in order to pay for SNF. Patient was admitted to inpatient on 11/13/17. CSW also explained that if patient progresses to home health with PT then medicare will not pay for SNF. Patient verbalized her understanding. FL2 complete and faxed out.  CSW presented bed offers to patient and she chose [facility]. Patient is okay with a semi-private room. [facility] admissions coordinator at [facility] is aware of accepted bed offers.   PT is now recommending home health. RN case  manager aware of above. CSW will continue to follow and assist as needed.    Employment status:  Disabled (Comment on whether or not currently receiving Disability), Retired Forensic scientist:  Medicare PT Recommendations:  [place] / Referral to community resources:  [place]  Patient/Family's Response to care:  Patient is agreeable to D/C to [facility] however understands that she may have to D/C home pending progress with PT.   Patient/Family's Understanding of and Emotional Response to Diagnosis, Current Treatment, and Prognosis:  Patient was very pleasant and thanked CSW for assistance.   Emotional Assessment Appearance:  Appears stated age Attitude/Demeanor/Rapport:    Affect (typically observed):  Accepting, Adaptable, Pleasant Orientation:  Oriented to Self, Oriented to Place, Oriented to  Time, Oriented to Situation Alcohol / Substance use:  Not Applicable Psych involvement (Current and /or in the community):  No (Comment)  Discharge Needs  Concerns to be addressed:  Discharge Planning Concerns Readmission within the last 30 days:  No Current discharge risk:  Dependent with Mobility Barriers to Discharge:  Continued Medical Work up   [employer], Veronia Beets, LCSW 11/14/2017, 2:41 PM

## 2017-11-15 LAB — BASIC METABOLIC PANEL
Anion gap: 6 (ref 5–15)
BUN: 14 mg/dL (ref 8–23)
CALCIUM: 8.9 mg/dL (ref 8.9–10.3)
CO2: 26 mmol/L (ref 22–32)
CREATININE: 0.8 mg/dL (ref 0.44–1.00)
Chloride: 107 mmol/L (ref 98–111)
Glucose, Bld: 139 mg/dL — ABNORMAL HIGH (ref 70–99)
Potassium: 3.6 mmol/L (ref 3.5–5.1)
SODIUM: 139 mmol/L (ref 135–145)

## 2017-11-15 LAB — CBC
HCT: 32.4 % — ABNORMAL LOW (ref 35.0–47.0)
Hemoglobin: 11 g/dL — ABNORMAL LOW (ref 12.0–16.0)
MCH: 31.6 pg (ref 26.0–34.0)
MCHC: 33.9 g/dL (ref 32.0–36.0)
MCV: 93.4 fL (ref 80.0–100.0)
PLATELETS: 152 10*3/uL (ref 150–440)
RBC: 3.47 MIL/uL — AB (ref 3.80–5.20)
RDW: 12.8 % (ref 11.5–14.5)
WBC: 12.6 10*3/uL — AB (ref 3.6–11.0)

## 2017-11-15 NOTE — Progress Notes (Signed)
PT is recommending SNF. Plan is for patient to D/C to [facility] tomorrow pending medical clearance. [facility] admissions coordinator at [facility] is aware of above.   [employer], LCSW [phone]

## 2017-11-15 NOTE — Progress Notes (Signed)
Physical Therapy Treatment Patient Details Name: Colleen [name] MRN: [id number] DOB: 25-Jun-1930 Today's Date: 11/15/2017    History of Present Illness Patient underwent right TKR without reported post-op complications. PT evaluation performed on POD#0. PMH includes asthma, CVA, aortic valve disorder, breast cancer, GERD, hyperlipidemia and hypertension.    PT Comments    Pt states that she is not doing well today and states that she is very limited this session due to pain. Pt only able to amb 50' with RW, CGA and chair follow. Pt instructed in there-ex which she tolerates well without drastic increase in pain. Pt states that pain is 3/10 at beginning of session. Pt demonstrates decreased ability noted through increased assistance needed with mobility. Pt met all PT goals prior to session. Pt states that her plan all along has been to d/c to SNF due no assistance available from family at home. PA note states that plan is for pt to d/c to SNF tomorrow. Pt aarom at this session 5-90 degrees. PT feels that in pts current state safest d/c recommendation is to SNF however plans to reassess this recommendation based on afternoon session. Pt could benefit from continued skilled therapy at this time to improve deficits toward PLOF. PT will continue to work with pt BID while admitted.     Follow Up Recommendations  SNF     Equipment Recommendations  Rolling walker with 5" wheels;3in1 (PT)    Recommendations for Other Services       Precautions / Restrictions Precautions Precautions: Fall Restrictions Weight Bearing Restrictions: Yes RLE Weight Bearing: Weight bearing as tolerated    Mobility  Bed Mobility Overal bed mobility: Needs Assistance Bed Mobility: Supine to Sit     Supine to sit: Min assist     General bed mobility comments: pt requires min assistance to initiate movement of R LE to move toward EOB. required decreasing amount of assistance throughout  movement.  Transfers Overall transfer level: Needs assistance Equipment used: Rolling walker (2 wheeled) Transfers: Sit to/from Stand Sit to Stand: Min assist         General transfer comment: pt requires min assist and verbal cuing for safe hand placement during transfer. Requires min assist at beginning of movement decreasing throughout transfer. Pt able to maintain upright posture with CGA once standing.  Ambulation/Gait Ambulation/Gait assistance: Min guard Gait Distance (Feet): 50 Feet Assistive device: Rolling walker (2 wheeled)   Gait velocity: slow, WFL for very limited household distances   General Gait Details: pt amb 44' with CGA, RW, and a chair follow. Pt states that she is limited due to pain during gait. Pt required continued cuing to increase stride length as well as verbal cuing and min assist to advance walker smoothly.   Stairs             Wheelchair Mobility    Modified Rankin (Stroke Patients Only)       Balance                                            Cognition Arousal/Alertness: Awake/alert Behavior During Therapy: WFL for tasks assessed/performed Overall Cognitive Status: Within Functional Limits for tasks assessed  Exercises Total Joint Exercises Goniometric ROM: pt R aarom 5-90 degrees Other Exercises Other Exercises: pt instructed in ther-ex to R LE x15 reps each including SLR, ankle pumps, weight shifting in standing, LAQ, and quad sets. Pt states that she is unable to initiate motion for SLR and LAQ, min assist to initiate movement decreased to CGA during exercise.    General Comments        Pertinent Vitals/Pain Pain Assessment: 0-10 Pain Score: 3  Pain Location: R knee Pain Descriptors / Indicators: Operative site guarding Pain Intervention(s): Limited activity within patient's tolerance;Monitored during session;Premedicated before session;Ice  applied    Home Living                      Prior Function            PT Goals (current goals can now be found in the care plan section) Acute Rehab PT Goals Patient Stated Goal: "I think I really need to go to rehab, I'm worried about being alone at night." PT Goal Formulation: With patient Time For Goal Achievement: 11/27/17 Potential to Achieve Goals: Good Progress towards PT goals: Progressing toward goals    Frequency    BID      PT Plan Discharge plan needs to be updated    Co-evaluation              AM-PAC PT "6 Clicks" Daily Activity  Outcome Measure  Difficulty turning over in bed (including adjusting bedclothes, sheets and blankets)?: A Little Difficulty moving from lying on back to sitting on the side of the bed? : Unable Difficulty sitting down on and standing up from a chair with arms (e.g., wheelchair, bedside commode, etc,.)?: Unable Help needed moving to and from a bed to chair (including a wheelchair)?: A Little Help needed walking in hospital room?: A Little Help needed climbing 3-5 steps with a railing? : A Lot 6 Click Score: 13    End of Session Equipment Utilized During Treatment: Gait belt Activity Tolerance: Patient limited by pain Patient left: in chair;with call bell/phone within reach;with chair alarm set;with SCD's reapplied Nurse Communication: Mobility status PT Visit Diagnosis: Unsteadiness on feet (R26.81);Muscle weakness (generalized) (M62.81);Pain Pain - Right/Left: Right Pain - part of body: Knee     Time: 2956-2130 PT Time Calculation (min) (ACUTE ONLY): 45 min  Charges:                       G Codes:       Caraline Deutschman Marylu Lund, SPT    Keelan Pomerleau 11/15/2017, 12:01 PM

## 2017-11-15 NOTE — Progress Notes (Addendum)
  Subjective: 2 Days Post-Op Procedure(s) (LRB): TOTAL KNEE ARTHROPLASTY (Right) Patient reports pain as mild.   Patient is well, and has had no acute complaints or problems PT and Care management to assist with discharge planning Current plan is for discharge to SNF. Negative for chest pain and shortness of breath Fever: no Gastrointestinal:Negative for nausea and vomiting  Objective: Vital signs in last 24 hours: Temp:  [97.6 F (36.4 C)-98.4 F (36.9 C)] 98.4 F (36.9 C) (07/10 2145) Pulse Rate:  [78-85] 79 (07/10 2145) Resp:  [18-19] 19 (07/10 2145) BP: (128-150)/(59-71) 150/71 (07/10 2145) SpO2:  [90 %-98 %] 92 % (07/10 2145)  Intake/Output from previous day:  Intake/Output Summary (Last 24 hours) at 11/15/2017 0800 Last data filed at 11/14/2017 0900 Gross per 24 hour  Intake 120 ml  Output -  Net 120 ml    Intake/Output this shift: No intake/output data recorded.  Labs: Recent Labs    11/14/17 0526 11/15/17 0611  HGB 12.3 11.0*   Recent Labs    11/14/17 0526 11/15/17 0611  WBC 11.5* 12.6*  RBC 3.85 3.47*  HCT 36.0 32.4*  PLT 169 152   Recent Labs    11/14/17 0526 11/15/17 0611  NA 141 139  K 3.9 3.6  CL 108 107  CO2 25 26  BUN 10 14  CREATININE 1.04* 0.80  GLUCOSE 132* 139*  CALCIUM 8.9 8.9   Recent Labs    11/13/17 0853  INR 1.00     EXAM General - Patient is Alert, Appropriate and Oriented Extremity - ABD soft Neurovascular intact Sensation intact distally Intact pulses distally Dorsiflexion/Plantar flexion intact Incision: scant drainage No cellulitis present Dressing/Incision - Blood tinged drainage this AM. Motor Function - intact, moving foot and toes well on exam.   Past Medical History:  Diagnosis Date  . Aortic valve disorder   . Arthritis   . Asthma   . Atrial fibrillation ([place])   . Breast cancer ([place]) 2004   LT MASTECTOMY  . Fibrocystic disease of both breasts   . GERD (gastroesophageal reflux disease)   . Hx  of colonic polyps   . Hyperlipidemia   . Hypertension   . Malignant neoplasm of breast ([place])    left  . Osteoarthritis of right knee 2019  . Stroke [facility]) 2008   right peripheral vision is gone    Assessment/Plan: 2 Days Post-Op Procedure(s) (LRB): TOTAL KNEE ARTHROPLASTY (Right) Active Problems:   Status post total knee replacement using cement, right  Estimated body mass index is 29.1 kg/m as calculated from the following:   Height as of this encounter: 5\' 1"  (1.549 m).   Weight as of this encounter: 69.9 kg (154 lb). Up with therapy   Labs reviewed this morning.  CBC and BMP ordered for tomorrow morning. Up with therapy today.  Pt was able to walk 280 feet yesterday and was also able to perform stairs however she reports a significant amount of pain today, and patient lives at home and would like to go to rehab due to little family support at home. Pt is urinating well, will continue to work on a BM. Plan at this time will be for discharge to SNF tomorrow based on progress with PT today.  DVT Prophylaxis - Foot Pumps, TED hose and Pradaxa Weight-Bearing as tolerated to right leg  J. Cameron Proud, PA-C [facility] Orthopaedic Surgery 11/15/2017, 8:00 AM

## 2017-11-15 NOTE — Progress Notes (Signed)
Physical Therapy Treatment Patient Details Name: Colleen [name] MRN: [id number] DOB: 1931-04-16 Today's Date: 11/15/2017    History of Present Illness Patient underwent right TKR without reported post-op complications. PT evaluation performed on POD#0. PMH includes asthma, CVA, aortic valve disorder, breast cancer, GERD, hyperlipidemia and hypertension.    PT Comments    Pt states that she is not doing well this afternoon. Pt states that she is in severe pain and refuses to participate in mobility with PT. Pt instructed in there-ex which she tolerates well. Pt requires continued verbal encouragement due to being frustrated about decline today from yesterday. Extensive education given to patient about d/c recommendations. At this time pt is drastically limited due to pain. Pt could benefit from continued skilled therapy at this time to improve deficits toward PLOF. PT will continue to work with pt BID while admitted. D/c recommendations continue to be SNF.    SNF     Equipment Recommendations  Rolling walker with 5" wheels;3in1 (PT)    Recommendations for Other Services       Precautions / Restrictions Precautions Precautions: Fall Restrictions Weight Bearing Restrictions: Yes RLE Weight Bearing: Weight bearing as tolerated    Mobility  Bed Mobility               General bed mobility comments: not assessed this visit. Pt up in chair upon arrival and prefers to remain up in chair upon completion of session.  Transfers                 General transfer comment: Pt refuses to transfer this session due to pain  Ambulation/Gait             General Gait Details: not assessed this visit. Pt prefers to remain in chair during session. Pt states that she cant get up due to pain   Stairs             Wheelchair Mobility    Modified Rankin (Stroke Patients Only)       Balance                                            Cognition  Arousal/Alertness: Awake/alert Behavior During Therapy: WFL for tasks assessed/performed Overall Cognitive Status: Within Functional Limits for tasks assessed                                        Exercises Other Exercises Other Exercises: pt instructed in ther-ex to R LE x15 reps each including SLR, ankle pumps, LAQ, quad sets, hip abd, hip adduction isometrics. Pt states that she is unable to initiate motion for SLR and LAQ, min assist to initiate movement decreased to CGA during exercise.    General Comments        Pertinent Vitals/Pain Pain Assessment: 0-10 Pain Score: 6  Pain Location: R knee Pain Descriptors / Indicators: Operative site guarding Pain Intervention(s): Limited activity within patient's tolerance;Monitored during session;Premedicated before session;Ice applied    Home Living                      Prior Function            PT Goals (current goals can now be found in the care plan section) Acute Rehab PT  Goals Patient Stated Goal: "I think I really need to go to rehab, I'm worried about being alone at night." PT Goal Formulation: With patient Time For Goal Achievement: 11/27/17 Potential to Achieve Goals: Good Progress towards PT goals: Progressing toward goals    Frequency    BID      PT Plan Discharge plan needs to be updated    Co-evaluation              AM-PAC PT "6 Clicks" Daily Activity  Outcome Measure  Difficulty turning over in bed (including adjusting bedclothes, sheets and blankets)?: A Little Difficulty moving from lying on back to sitting on the side of the bed? : Unable Difficulty sitting down on and standing up from a chair with arms (e.g., wheelchair, bedside commode, etc,.)?: Unable Help needed moving to and from a bed to chair (including a wheelchair)?: A Lot Help needed walking in hospital room?: A Lot Help needed climbing 3-5 steps with a railing? : A Lot 6 Click Score: 11    End of Session    Activity Tolerance: Patient limited by pain Patient left: in chair;with call bell/phone within reach;with chair alarm set;with SCD's reapplied;with family/visitor present Nurse Communication: Mobility status PT Visit Diagnosis: Unsteadiness on feet (R26.81);Muscle weakness (generalized) (M62.81);Pain Pain - Right/Left: Right Pain - part of body: Knee     Time: 1337-1401 PT Time Calculation (min) (ACUTE ONLY): 24 min  Charges:                       G Codes:       Brandan Glauber Marylu Lund, SPT    Makhayla Mcmurry 11/15/2017, 5:04 PM

## 2017-11-16 ENCOUNTER — Inpatient Hospital Stay
Admission: EM | Admit: 2017-11-16 | Discharge: 2017-11-19 | Disposition: A | Discharge status: Skilled Nursing Facility | Payer: Medicare Other | Source: Home / Self Care | Attending: Internal Medicine | Admitting: Internal Medicine

## 2017-11-16 ENCOUNTER — Inpatient Hospital Stay: Payer: Medicare Other

## 2017-11-16 ENCOUNTER — Other Ambulatory Visit: Payer: Self-pay

## 2017-11-16 ENCOUNTER — Emergency Department: Payer: Medicare Other

## 2017-11-16 ENCOUNTER — Encounter: Payer: Self-pay | Admitting: Intensive Care

## 2017-11-16 DIAGNOSIS — J9601 Acute respiratory failure with hypoxia: Secondary | ICD-10-CM

## 2017-11-16 DIAGNOSIS — R609 Edema, unspecified: Secondary | ICD-10-CM

## 2017-11-16 DIAGNOSIS — A419 Sepsis, unspecified organism: Secondary | ICD-10-CM | POA: Diagnosis present

## 2017-11-16 LAB — CBC
HEMATOCRIT: 33.3 % — AB (ref 35.0–47.0)
HEMOGLOBIN: 11.3 g/dL — AB (ref 12.0–16.0)
MCH: 31.9 pg (ref 26.0–34.0)
MCHC: 34 g/dL (ref 32.0–36.0)
MCV: 93.9 fL (ref 80.0–100.0)
Platelets: 146 10*3/uL — ABNORMAL LOW (ref 150–440)
RBC: 3.55 MIL/uL — ABNORMAL LOW (ref 3.80–5.20)
RDW: 12.9 % (ref 11.5–14.5)
WBC: 10.8 10*3/uL (ref 3.6–11.0)

## 2017-11-16 LAB — URINALYSIS, ROUTINE W REFLEX MICROSCOPIC
Bilirubin Urine: NEGATIVE
Glucose, UA: NEGATIVE mg/dL
HGB URINE DIPSTICK: NEGATIVE
Ketones, ur: NEGATIVE mg/dL
Leukocytes, UA: NEGATIVE
NITRITE: NEGATIVE
Protein, ur: NEGATIVE mg/dL
SPECIFIC GRAVITY, URINE: 1.008 (ref 1.005–1.030)
pH: 6 (ref 5.0–8.0)

## 2017-11-16 LAB — BASIC METABOLIC PANEL
Anion gap: 8 (ref 5–15)
BUN: 13 mg/dL (ref 8–23)
CHLORIDE: 100 mmol/L (ref 98–111)
CO2: 28 mmol/L (ref 22–32)
Calcium: 8.9 mg/dL (ref 8.9–10.3)
Creatinine, Ser: 0.86 mg/dL (ref 0.44–1.00)
GFR calc Af Amer: >60 mL/min (ref >60)
GFR calc non Af Amer: >60 mL/min (ref >60)
GLUCOSE: 114 mg/dL — AB (ref 70–99)
Potassium: 4.1 mmol/L (ref 3.5–5.1)
Sodium: 136 mmol/L (ref 135–145)

## 2017-11-16 LAB — CBC WITH DIFFERENTIAL/PLATELET
BASOS ABS: 0 10*3/uL (ref 0–0.1)
Basophils Relative: 0 %
Eosinophils Absolute: 0 10*3/uL (ref 0–0.7)
Eosinophils Relative: 0 %
HEMATOCRIT: 34.3 % — AB (ref 35.0–47.0)
HEMOGLOBIN: 11.7 g/dL — AB (ref 12.0–16.0)
LYMPHS ABS: 0.9 10*3/uL — AB (ref 1.0–3.6)
LYMPHS PCT: 8 %
MCH: 31.9 pg (ref 26.0–34.0)
MCHC: 34 g/dL (ref 32.0–36.0)
MCV: 93.8 fL (ref 80.0–100.0)
Monocytes Absolute: 0.9 10*3/uL (ref 0.2–0.9)
Monocytes Relative: 8 %
NEUTROS ABS: 9.5 10*3/uL — AB (ref 1.4–6.5)
Neutrophils Relative %: 84 %
Platelets: 156 10*3/uL (ref 150–440)
RBC: 3.65 MIL/uL — AB (ref 3.80–5.20)
RDW: 12.8 % (ref 11.5–14.5)
WBC: 11.3 10*3/uL — AB (ref 3.6–11.0)

## 2017-11-16 LAB — COMPREHENSIVE METABOLIC PANEL
ALK PHOS: 20 U/L — AB (ref 38–126)
ALT: 10 U/L (ref 0–44)
ANION GAP: 9 (ref 5–15)
AST: 30 U/L (ref 15–41)
Albumin: 3.3 g/dL — ABNORMAL LOW (ref 3.5–5.0)
BUN: 13 mg/dL (ref 8–23)
CALCIUM: 9.1 mg/dL (ref 8.9–10.3)
CO2: 27 mmol/L (ref 22–32)
Chloride: 98 mmol/L (ref 98–111)
Creatinine, Ser: 0.8 mg/dL (ref 0.44–1.00)
GFR calc non Af Amer: >60 mL/min (ref >60)
Glucose, Bld: 103 mg/dL — ABNORMAL HIGH (ref 70–99)
Potassium: 3.9 mmol/L (ref 3.5–5.1)
SODIUM: 134 mmol/L — AB (ref 135–145)
Total Bilirubin: 1.6 mg/dL — ABNORMAL HIGH (ref 0.3–1.2)
Total Protein: 6.7 g/dL (ref 6.5–8.1)

## 2017-11-16 LAB — BRAIN NATRIURETIC PEPTIDE: B NATRIURETIC PEPTIDE 5: 518 pg/mL — AB (ref 0.0–100.0)

## 2017-11-16 LAB — LIPASE, BLOOD: Lipase: 27 U/L (ref 11–51)

## 2017-11-16 LAB — TROPONIN I: TROPONIN I: 0.03 ng/mL — AB (ref <0.03)

## 2017-11-16 LAB — PROTIME-INR
INR: 1.67
Prothrombin Time: 19.6 seconds — ABNORMAL HIGH (ref 11.4–15.2)

## 2017-11-16 LAB — PROCALCITONIN: Procalcitonin: 0.12 ng/mL

## 2017-11-16 LAB — LACTIC ACID, PLASMA
Lactic Acid, Venous: 1.4 mmol/L (ref 0.5–1.9)
Lactic Acid, Venous: 1 mmol/L (ref 0.5–1.9)

## 2017-11-16 MED ORDER — DOCUSATE SODIUM 100 MG PO CAPS
100.0000 mg | ORAL_CAPSULE | Freq: Two times a day (BID) | ORAL | Status: DC
  Administered 2017-11-16 – 2017-11-19 (×6): 100 mg via ORAL
  Filled 2017-11-16 (×6): qty 1

## 2017-11-16 MED ORDER — TRAMADOL HCL 50 MG PO TABS: 50.0000 mg | ORAL_TABLET | Freq: Four times a day (QID) | ORAL | 0 refills | Status: DC | PRN

## 2017-11-16 MED ORDER — ALBUTEROL SULFATE (2.5 MG/3ML) 0.083% IN NEBU: 2.5000 mg | INHALATION_SOLUTION | RESPIRATORY_TRACT | Status: DC | PRN

## 2017-11-16 MED ORDER — ACETAMINOPHEN 500 MG PO TABS
ORAL_TABLET | ORAL | Status: AC
  Administered 2017-11-16: 1000 mg via ORAL
  Filled 2017-11-16: qty 2

## 2017-11-16 MED ORDER — POLYETHYLENE GLYCOL 3350 17 G PO PACK: 17.0000 g | PACK | Freq: Every day | ORAL | Status: DC | PRN

## 2017-11-16 MED ORDER — NIFEDIPINE ER OSMOTIC RELEASE 30 MG PO TB24
30.0000 mg | ORAL_TABLET | Freq: Every day | ORAL | Status: DC
  Administered 2017-11-17 – 2017-11-19 (×3): 30 mg via ORAL
  Filled 2017-11-16 (×3): qty 1

## 2017-11-16 MED ORDER — ACETAMINOPHEN 325 MG PO TABS: 650.0000 mg | ORAL_TABLET | Freq: Four times a day (QID) | ORAL | Status: DC | PRN

## 2017-11-16 MED ORDER — DABIGATRAN ETEXILATE MESYLATE 150 MG PO CAPS
150.0000 mg | ORAL_CAPSULE | Freq: Two times a day (BID) | ORAL | Status: DC
  Administered 2017-11-16 – 2017-11-19 (×6): 150 mg via ORAL
  Filled 2017-11-16 (×7): qty 1

## 2017-11-16 MED ORDER — ENOXAPARIN SODIUM 80 MG/0.8ML ~~LOC~~ SOLN
1.0000 mg/kg | Freq: Once | SUBCUTANEOUS | Status: AC
  Administered 2017-11-16: 70 mg via SUBCUTANEOUS
  Filled 2017-11-16: qty 0.8

## 2017-11-16 MED ORDER — ENOXAPARIN SODIUM 40 MG/0.4ML ~~LOC~~ SOLN: 40.0000 mg | SUBCUTANEOUS | Status: DC

## 2017-11-16 MED ORDER — SPIRONOLACTONE 25 MG PO TABS
25.0000 mg | ORAL_TABLET | Freq: Every day | ORAL | Status: DC
  Administered 2017-11-17 – 2017-11-19 (×3): 25 mg via ORAL
  Filled 2017-11-16 (×3): qty 1

## 2017-11-16 MED ORDER — FOLIC ACID 1 MG PO TABS
500.0000 ug | ORAL_TABLET | Freq: Every day | ORAL | Status: DC
  Administered 2017-11-17 – 2017-11-19 (×3): 0.5 mg via ORAL
  Filled 2017-11-16 (×3): qty 1

## 2017-11-16 MED ORDER — ACETAMINOPHEN 500 MG PO TABS
1000.0000 mg | ORAL_TABLET | Freq: Once | ORAL | Status: AC
  Administered 2017-11-16: 1000 mg via ORAL

## 2017-11-16 MED ORDER — OXYCODONE HCL 5 MG PO TABS: 5.0000 mg | ORAL_TABLET | ORAL | 0 refills | Status: DC | PRN

## 2017-11-16 MED ORDER — LORATADINE 10 MG PO TABS
10.0000 mg | ORAL_TABLET | Freq: Every day | ORAL | Status: DC
  Administered 2017-11-17 – 2017-11-19 (×3): 10 mg via ORAL
  Filled 2017-11-16 (×3): qty 1

## 2017-11-16 MED ORDER — MONTELUKAST SODIUM 10 MG PO TABS
10.0000 mg | ORAL_TABLET | Freq: Every day | ORAL | Status: DC
  Administered 2017-11-16 – 2017-11-18 (×3): 10 mg via ORAL
  Filled 2017-11-16 (×3): qty 1

## 2017-11-16 MED ORDER — ATENOLOL 25 MG PO TABS
50.0000 mg | ORAL_TABLET | Freq: Two times a day (BID) | ORAL | Status: DC
  Administered 2017-11-16 – 2017-11-19 (×5): 50 mg via ORAL
  Filled 2017-11-16 (×6): qty 2

## 2017-11-16 MED ORDER — FUROSEMIDE 10 MG/ML IJ SOLN
60.0000 mg | Freq: Once | INTRAMUSCULAR | Status: AC
  Administered 2017-11-16: 60 mg via INTRAVENOUS
  Filled 2017-11-16: qty 8

## 2017-11-16 MED ORDER — ONDANSETRON HCL 4 MG PO TABS: 4.0000 mg | ORAL_TABLET | Freq: Four times a day (QID) | ORAL | Status: DC | PRN

## 2017-11-16 MED ORDER — IOPAMIDOL (ISOVUE-370) INJECTION 76%
75.0000 mL | Freq: Once | INTRAVENOUS | Status: AC | PRN
  Administered 2017-11-16: 75 mL via INTRAVENOUS

## 2017-11-16 MED ORDER — ACETAMINOPHEN 650 MG RE SUPP: 650.0000 mg | Freq: Four times a day (QID) | RECTAL | Status: DC | PRN

## 2017-11-16 MED ORDER — VANCOMYCIN HCL IN DEXTROSE 1-5 GM/200ML-% IV SOLN
1000.0000 mg | INTRAVENOUS | Status: DC
  Administered 2017-11-16 – 2017-11-19 (×4): 1000 mg via INTRAVENOUS
  Filled 2017-11-16 (×4): qty 200

## 2017-11-16 MED ORDER — SODIUM CHLORIDE 0.9 % IV SOLN
2.0000 g | Freq: Two times a day (BID) | INTRAVENOUS | Status: DC
  Administered 2017-11-17 – 2017-11-18 (×4): 2 g via INTRAVENOUS
  Filled 2017-11-16 (×7): qty 2

## 2017-11-16 MED ORDER — ONDANSETRON HCL 4 MG/2ML IJ SOLN
4.0000 mg | Freq: Four times a day (QID) | INTRAMUSCULAR | Status: DC | PRN
Start: 2017-11-16 — End: 2017-11-19

## 2017-11-16 MED ORDER — FLUTICASONE PROPIONATE 50 MCG/ACT NA SUSP
2.0000 | Freq: Every day | NASAL | Status: DC
  Administered 2017-11-17 – 2017-11-19 (×3): 2 via NASAL
  Filled 2017-11-16: qty 16

## 2017-11-16 MED ORDER — CEFEPIME HCL 2 G IJ SOLR
2.0000 g | INTRAMUSCULAR | Status: AC
  Administered 2017-11-16: 2 g via INTRAVENOUS
  Filled 2017-11-16: qty 2

## 2017-11-16 MED ORDER — SODIUM CHLORIDE 0.9 % IV BOLUS
1000.0000 mL | Freq: Once | INTRAVENOUS | Status: AC
Start: 2017-11-16 — End: 2017-11-16
  Administered 2017-11-16: 1000 mL via INTRAVENOUS

## 2017-11-16 MED ORDER — FUROSEMIDE 20 MG PO TABS
20.0000 mg | ORAL_TABLET | Freq: Every day | ORAL | Status: DC
  Administered 2017-11-17 – 2017-11-18 (×2): 20 mg via ORAL
  Filled 2017-11-16 (×3): qty 1

## 2017-11-16 MED ORDER — ORAL CARE MOUTH RINSE
15.0000 mL | Freq: Two times a day (BID) | OROMUCOSAL | Status: DC
  Administered 2017-11-16: 15 mL via OROMUCOSAL

## 2017-11-16 MED ORDER — VANCOMYCIN HCL IN DEXTROSE 1-5 GM/200ML-% IV SOLN
1000.0000 mg | INTRAVENOUS | Status: AC
  Administered 2017-11-16: 1000 mg via INTRAVENOUS
  Filled 2017-11-16: qty 200

## 2017-11-16 MED ORDER — LISINOPRIL 20 MG PO TABS
40.0000 mg | ORAL_TABLET | Freq: Two times a day (BID) | ORAL | Status: DC
  Administered 2017-11-17 – 2017-11-19 (×4): 40 mg via ORAL
  Filled 2017-11-16 (×5): qty 2

## 2017-11-16 MED ORDER — OXYCODONE HCL 5 MG PO TABS
5.0000 mg | ORAL_TABLET | ORAL | Status: DC | PRN
  Administered 2017-11-17: 5 mg via ORAL
  Administered 2017-11-18: 10 mg via ORAL
  Administered 2017-11-19: 5 mg via ORAL
  Filled 2017-11-16 (×2): qty 1
  Filled 2017-11-16: qty 2

## 2017-11-16 MED ORDER — ROSUVASTATIN CALCIUM 10 MG PO TABS
10.0000 mg | ORAL_TABLET | Freq: Every day | ORAL | Status: DC
  Administered 2017-11-16 – 2017-11-18 (×3): 10 mg via ORAL
  Filled 2017-11-16 (×3): qty 1

## 2017-11-16 MED ORDER — BISACODYL 10 MG RE SUPP
10.0000 mg | Freq: Every day | RECTAL | Status: DC | PRN
  Filled 2017-11-16: qty 1

## 2017-11-16 MED ORDER — ACETAMINOPHEN 10 MG/ML IV SOLN
1000.0000 mg | Freq: Once | INTRAVENOUS | Status: DC
Start: 2017-11-16 — End: 2017-11-16
  Filled 2017-11-16: qty 100

## 2017-11-16 NOTE — Progress Notes (Signed)
CODE SEPSIS - PHARMACY COMMUNICATION  **Broad Spectrum Antibiotics should be administered within 1 hour of Sepsis diagnosis**  Time Code Sepsis Called/Page Received: 14:58  Antibiotics Ordered: Vancomycin and Cefepime  Time of 1st antibiotic administration: Cefepime given at 15:24  Additional action taken by pharmacy: n/a  If necessary, Name of Provider/Nurse Contacted: n/a    Vira Blanco ,PharmD Clinical Pharmacist  11/16/2017  3:34 PM

## 2017-11-16 NOTE — H&P (Signed)
[facility] NAME: Colleen [name]    MR#:  [id number]  DATE OF BIRTH:  01-Jul-1930  DATE OF ADMISSION:  11/16/2017  PRIMARY CARE PHYSICIAN: Baxter Hire, MD   REQUESTING/REFERRING PHYSICIAN: Dr. Mable Paris  CHIEF COMPLAINT:   Chief Complaint  Patient presents with  . Fever  . Shortness of Breath    HISTORY OF PRESENT ILLNESS:  Colleen [name]  is a 84 y.o. female with a known history of paroxysmal atrial fibrillation, arthritis, hypertension, past tobacco use was recently in the hospital and had right total knee arthroplasty on 11/13/2017.  Patient was discharged to skilled nursing facility earlier today.  During her first set of vitals at the skilled nursing facility she was found to be febrile and oxygen saturations 89% on room air.  Patient was sent back to the emergency room.  Here CT scan of the chest was done to rule out PE.  No PE found.  Did raise concern for mild pulmonary edema but no infiltrates.  Urinalysis normal.  Patient had temperature 101.4 with tachycardia in the ED.  Oxygen saturations 86 to 88% on room air Patient has 25-pack-year smoking history but quit many years back.  Was diagnosed with COPD according to the patient in the past but later told that she does not have it. She is being admitted for work-up of sepsis and acute hypoxic respiratory failure.  PAST MEDICAL HISTORY:   Past Medical History:  Diagnosis Date  . Aortic valve disorder   . Arthritis   . Asthma   . Atrial fibrillation ([place])   . Breast cancer ([place]) 2004   LT MASTECTOMY  . Fibrocystic disease of both breasts   . GERD (gastroesophageal reflux disease)   . Hx of colonic polyps   . Hyperlipidemia   . Hypertension   . Malignant neoplasm of breast ([place])    left  . Osteoarthritis of right knee 2019  . Stroke [facility]) 2008   right peripheral vision is gone    PAST SURGICAL HISTORY:   Past Surgical History:  Procedure Laterality Date  .  ABDOMINAL HYSTERECTOMY  1968  . APPENDECTOMY    . BREAST EXCISIONAL BIOPSY Right 1986   NEG  . CATARACT EXTRACTION W/ INTRAOCULAR LENS  IMPLANT, BILATERAL    . COLONOSCOPY  2000, 2003, 2008, 2013  . deviated septum repair  1973  . ESOPHAGOGASTRODUODENOSCOPY  2013  . EYE SURGERY    . MASTECTOMY Left 2004   BREAST CA  . TONSILLECTOMY    . TOTAL KNEE ARTHROPLASTY Right 11/13/2017   Procedure: TOTAL KNEE ARTHROPLASTY;  Surgeon: Corky Mull, MD;  Location: [facility] ORS;  Service: Orthopedics;  Laterality: Right;    SOCIAL HISTORY:   Social History   Tobacco Use  . Smoking status: Former Smoker    Types: Cigarettes    Last attempt to quit: 1984    Years since quitting: 35.5  . Smokeless tobacco: Never Used  Substance Use Topics  . Alcohol use: Yes    Alcohol/week: 0.0 oz    Comment: occassionally    FAMILY HISTORY:   Family History  Problem Relation Age of Onset  . Breast cancer Cousin   . Breast cancer Other   . Diabetes Mother   . Heart disease Mother   . Heart disease Father   . Heart disease Sister   . Heart disease Brother     DRUG ALLERGIES:   Allergies  Allergen Reactions  . Ibuprofen  Swelling  . Codeine Anxiety    Hallucination.    REVIEW OF SYSTEMS:   Review of Systems  Constitutional: Positive for fever and malaise/fatigue. Negative for chills.  HENT: Negative for sore throat.   Eyes: Negative for blurred vision, double vision and pain.  Respiratory: Negative for cough, hemoptysis, shortness of breath and wheezing.   Cardiovascular: Negative for chest pain, palpitations, orthopnea and leg swelling.  Gastrointestinal: Negative for abdominal pain, constipation, diarrhea, heartburn, nausea and vomiting.  Genitourinary: Negative for dysuria and hematuria.  Musculoskeletal: Negative for back pain and joint pain.  Skin: Negative for rash.  Neurological: Negative for sensory change, speech change, focal weakness and headaches.  Endo/Heme/Allergies: Does not  bruise/bleed easily.  Psychiatric/Behavioral: Negative for depression. The patient is not nervous/anxious.     MEDICATIONS AT HOME:   Prior to Admission medications   Medication Sig Start Date End Date Taking? Authorizing Provider  atenolol (TENORMIN) 50 MG tablet Take 50 mg by mouth 2 (two) times daily.  08/30/14  Yes [provider]  b complex vitamins tablet Take 1 tablet by mouth daily.   Yes [provider]  Biotin 1000 MCG tablet Take 1,000 mcg by mouth daily.   Yes [provider]  Calcium Carb-Cholecalciferol (CALCIUM 600+D3 PO) Take 1 tablet by mouth daily.   Yes [provider]  cetirizine (ZYRTEC) 10 MG tablet Take 10 mg by mouth daily.   Yes [provider]  cholecalciferol (VITAMIN D-400) 400 units TABS tablet Take 400 Units by mouth daily.   Yes [provider]  fluticasone (FLONASE) 50 MCG/ACT nasal spray Place 2 sprays into both nostrils daily.    Yes [provider]  folic acid (FOLVITE) 277 MCG tablet Take 400 mcg by mouth daily.   Yes [provider]  furosemide (LASIX) 20 MG tablet Take 20 mg by mouth daily. 08/15/14  Yes [provider]  lisinopril (PRINIVIL,ZESTRIL) 40 MG tablet Take 40 mg by mouth 2 (two) times daily.   Yes [provider]  Misc Natural Products (OSTEO BI-FLEX ADV TRIPLE ST PO) Take 1 tablet by mouth 2 (two) times daily.   Yes [provider]  montelukast (SINGULAIR) 10 MG tablet Take 10 mg by mouth at bedtime.   Yes [provider]  Multiple Vitamin (MULTIVITAMIN WITH MINERALS) TABS tablet Take 1 tablet by mouth daily.   Yes [provider]  NIFEdipine (PROCARDIA-XL/ADALAT-CC/NIFEDICAL-XL) 30 MG 24 hr tablet Take 30 mg by mouth daily.   Yes [provider]  Omega-3 Fatty Acids (FISH OIL PO) Take 1 capsule by mouth daily.   Yes [provider]  PRADAXA 150 MG CAPS capsule Take 150 mg by mouth 2 (two) times daily. 10/06/14   Yes [provider]  rosuvastatin (CRESTOR) 10 MG tablet Take 10 mg by mouth at bedtime.   Yes [provider]  spironolactone (ALDACTONE) 25 MG tablet Take 25 mg by mouth daily.   Yes [provider]  vitamin C (ASCORBIC ACID) 500 MG tablet Take 1,000 mg by mouth daily.    Yes [provider]  vitamin E 400 UNIT capsule Take 400 Units by mouth daily.   Yes [provider]  oxyCODONE (OXY IR/ROXICODONE) 5 MG immediate release tablet Take 1-2 tablets (5-10 mg total) by mouth every 4 (four) hours as needed for moderate pain (pain score 4-6). 11/16/17   Lattie Corns, PA-C  traMADol (ULTRAM) 50 MG tablet Take 1-2 tablets (50-100 mg total) by mouth every 6 (  six) hours as needed for moderate pain. 11/16/17   Lattie Corns, PA-C     VITAL SIGNS:  Blood pressure (!) 115/55, pulse 99, temperature (!) 101.4 F (38.6 C), temperature source Rectal, resp. rate 19, height 5\' 1"  (1.549 m), weight 69.9 kg (154 lb), SpO2 96 %.  PHYSICAL EXAMINATION:  Physical Exam  GENERAL:  84 y.o.-year-old patient lying in the bed with no acute distress.  EYES: Pupils equal, round, reactive to light and accommodation. No scleral icterus. Extraocular muscles intact.  HEENT: Head atraumatic, normocephalic. Oropharynx and nasopharynx clear. No oropharyngeal erythema, moist oral mucosa  NECK:  Supple, no jugular venous distention. No thyroid enlargement, no tenderness.  LUNGS: Normal breath sounds bilaterally, no wheezing, rales, rhonchi. No use of accessory muscles of respiration.  CARDIOVASCULAR: S1, S2, tachycardia ABDOMEN: Soft, nontender, nondistended. Bowel sounds present. No organomegaly or mass.  EXTREMITIES: Right knee surgical wound with no purulent discharge.  Has surrounding swelling.  Right lower committee edema. NEUROLOGIC: Cranial nerves II through XII are intact. No focal Motor or sensory deficits appreciated b/l PSYCHIATRIC: The patient is alert and  oriented x 3. Good affect.  SKIN: No obvious rash, lesion, or ulcer.   LABORATORY PANEL:   CBC Recent Labs  Lab 11/16/17 1503  WBC 11.3*  HGB 11.7*  HCT 34.3*  PLT 156   ------------------------------------------------------------------------------------------------------------------  Chemistries  Recent Labs  Lab 11/16/17 1503  NA 134*  K 3.9  CL 98  CO2 27  GLUCOSE 103*  BUN 13  CREATININE 0.80  CALCIUM 9.1  AST 30  ALT 10  ALKPHOS 20*  BILITOT 1.6*   ------------------------------------------------------------------------------------------------------------------  Cardiac Enzymes Recent Labs  Lab 11/16/17 1503  TROPONINI 0.03*   ------------------------------------------------------------------------------------------------------------------  RADIOLOGY:  Ct Angio Chest Pe W/cm &/or Wo Cm  Result Date: 11/16/2017 CLINICAL DATA:  Fever and shortness of breath. Right calf pain. Recent right total knee replacement. EXAM: CT ANGIOGRAPHY CHEST WITH CONTRAST TECHNIQUE: Multidetector CT imaging of the chest was performed using the standard protocol during bolus administration of intravenous contrast. Multiplanar CT image reconstructions and MIPs were obtained to evaluate the vascular anatomy. CONTRAST:  51mL ISOVUE-370 IOPAMIDOL (ISOVUE-370) INJECTION 76% COMPARISON:  Chest x-ray dated 10/31/2017 FINDINGS: Cardiovascular: Mild cardiomegaly. No pulmonary emboli. Aortic atherosclerosis. Coronary artery calcifications. No pericardial effusion. Mediastinum/Nodes: Slight prominence of the mucosa throughout the esophagus. No appreciable hiatal hernia. Lungs/Pleura: There is a small right pleural effusion with slight interstitial edema dependent portions of the lobes of the right lung with a small amount of fluid along the fissures. Minimal atelectasis at the right lung base secondary to the fusion. Upper Abdomen: No acute abnormality. Extensive aortic atherosclerosis.  Musculoskeletal: No acute abnormalities. Review of the MIP images confirms the above findings. IMPRESSION: 1. No pulmonary emboli. 2. Mild cardiomegaly with a small right pleural effusion and slight dependent pulmonary edema in the right lung. This could represent mild congestive heart failure. 3.  Aortic Atherosclerosis (ICD10-I70.0). 4. Slight prominence of the mucosa of esophagus diffusely. This could represent esophagitis. Electronically Signed   By: Lorriane Shire M.D.   On: 11/16/2017 15:30     IMPRESSION AND PLAN:   *Sepsis Etiology is unclear.  CT scan of the chest shows no infiltrates.  Could be atelectasis. No UTI. We will also check right lower extremity venous ultrasound to rule out DVT which can cause fevers.  Will add incentive spirometer.  Blood culture sent and pending. We will continue vancomycin and cefepime until cultures are negative.  Consult orthopedics  due to recent right total knee arthroplasty which could also be the source.  *Acute hypoxic respiratory failure.  Mild pulmonary edema on CT scan of the chest.  Will give 1 dose of IV Lasix.  Could be due to fluids during an after surgery.  *Hypertension.  Continue home medications  *Paroxysmal atrial fibrillation.  On Pradaxa  All the records are reviewed and case discussed with ED provider. Management plans discussed with the patient, family and they are in agreement.  CODE STATUS: FULL CODE  TOTAL TIME TAKING CARE OF THIS PATIENT: 40 minutes.   Neita Carp M.D on 11/16/2017 at 4:12 PM  Between 7am to 6pm - Pager - [phone]  After 6pm go to [email or web address] - password [email or web address] [facility]  Office  [phone]  CC: Primary care physician; Baxter Hire, MD  Note: This dictation was prepared with Dragon dictation along with smaller phrase technology. Any transcriptional errors that result from this process are unintentional.

## 2017-11-16 NOTE — ED Provider Notes (Signed)
[facility] Emergency Department Provider Note  ____________________________________________   First MD Initiated Contact with Patient 11/16/17 1451     (approximate)  I have reviewed the triage vital signs and the nursing notes.   HISTORY  Chief Complaint Fever and Shortness of Breath   HPI Colleen [name] is a 84 y.o. female who comes to the emergency department via EMS with fever and shortness of breath that began about 30 minutes ago.  The patient was discharged from the hospital early this afternoon after an inpatient stay for  a right-sided knee replacement.  She initially did well postoperatively however on return to her rehab facility she was noted to be laboring to breathe and febrile so they called 911.  The patient has a long-standing history of atrial fibrillation for which she takes Pradaxa however she has not taken it for about a week secondary to the surgery.  She denies cough but does feel short of breath.  She denies chest pain.  Her symptoms came on gradually are now moderate to severe.  They are worse with exertion and somewhat improved when lying still.   Past Medical History:  Diagnosis Date  . Aortic valve disorder   . Arthritis   . Asthma   . Atrial fibrillation ([place])   . Breast cancer ([place]) 2004   LT MASTECTOMY  . Fibrocystic disease of both breasts   . GERD (gastroesophageal reflux disease)   . Hx of colonic polyps   . Hyperlipidemia   . Hypertension   . Malignant neoplasm of breast ([place])    left  . Osteoarthritis of right knee 2019  . Stroke [facility]) 2008   right peripheral vision is gone    Patient Active Problem List   Diagnosis Date Noted  . Sepsis ([place]) 11/16/2017  . Status post total knee replacement using cement, right 11/13/2017    Past Surgical History:  Procedure Laterality Date  . ABDOMINAL HYSTERECTOMY  1968  . APPENDECTOMY    . BREAST EXCISIONAL BIOPSY Right 1986   NEG  . CATARACT EXTRACTION W/ INTRAOCULAR  LENS  IMPLANT, BILATERAL    . COLONOSCOPY  2000, 2003, 2008, 2013  . deviated septum repair  1973  . ESOPHAGOGASTRODUODENOSCOPY  2013  . EYE SURGERY    . MASTECTOMY Left 2004   BREAST CA  . TONSILLECTOMY    . TOTAL KNEE ARTHROPLASTY Right 11/13/2017   Procedure: TOTAL KNEE ARTHROPLASTY;  Surgeon: Corky Mull, MD;  Location: [facility] ORS;  Service: Orthopedics;  Laterality: Right;    Prior to Admission medications   Medication Sig Start Date End Date Taking? Authorizing Provider  atenolol (TENORMIN) 50 MG tablet Take 50 mg by mouth 2 (two) times daily.  08/30/14  Yes [provider]  b complex vitamins tablet Take 1 tablet by mouth daily.   Yes [provider]  Biotin 1000 MCG tablet Take 1,000 mcg by mouth daily.   Yes [provider]  Calcium Carb-Cholecalciferol (CALCIUM 600+D3 PO) Take 1 tablet by mouth daily.   Yes [provider]  cetirizine (ZYRTEC) 10 MG tablet Take 10 mg by mouth daily.   Yes [provider]  cholecalciferol (VITAMIN D-400) 400 units TABS tablet Take 400 Units by mouth daily.   Yes [provider]  fluticasone (FLONASE) 50 MCG/ACT nasal spray Place 2 sprays into both nostrils daily.    Yes [provider]  folic acid (FOLVITE) 836 MCG tablet Take 400 mcg by mouth daily.  Yes [provider]  furosemide (LASIX) 20 MG tablet Take 20 mg by mouth daily. 08/15/14  Yes [provider]  lisinopril (PRINIVIL,ZESTRIL) 40 MG tablet Take 40 mg by mouth 2 (two) times daily.   Yes [provider]  Misc Natural Products (OSTEO BI-FLEX ADV TRIPLE ST PO) Take 1 tablet by mouth 2 (two) times daily.   Yes [provider]  montelukast (SINGULAIR) 10 MG tablet Take 10 mg by mouth at bedtime.   Yes [provider]  Multiple Vitamin (MULTIVITAMIN WITH MINERALS) TABS tablet Take 1 tablet by mouth daily.   Yes [provider]  NIFEdipine (PROCARDIA-XL/ADALAT-CC/NIFEDICAL-XL) 30  MG 24 hr tablet Take 30 mg by mouth daily.   Yes [provider]  Omega-3 Fatty Acids (FISH OIL PO) Take 1 capsule by mouth daily.   Yes [provider]  PRADAXA 150 MG CAPS capsule Take 150 mg by mouth 2 (two) times daily. 10/06/14  Yes [provider]  rosuvastatin (CRESTOR) 10 MG tablet Take 10 mg by mouth at bedtime.   Yes [provider]  spironolactone (ALDACTONE) 25 MG tablet Take 25 mg by mouth daily.   Yes [provider]  vitamin C (ASCORBIC ACID) 500 MG tablet Take 1,000 mg by mouth daily.    Yes [provider]  vitamin E 400 UNIT capsule Take 400 Units by mouth daily.   Yes [provider]  oxyCODONE (OXY IR/ROXICODONE) 5 MG immediate release tablet Take 1-2 tablets (5-10 mg total) by mouth every 4 (four) hours as needed for moderate pain (pain score 4-6). 11/16/17   Lattie Corns, PA-C  traMADol (ULTRAM) 50 MG tablet Take 1-2 tablets (50-100 mg total) by mouth every 6 (six) hours as needed for moderate pain. 11/16/17   Lattie Corns, PA-C    Allergies Ibuprofen and Codeine  Family History  Problem Relation Age of Onset  . Breast cancer Cousin   . Breast cancer Other   . Diabetes Mother   . Heart disease Mother   . Heart disease Father   . Heart disease Sister   . Heart disease Brother     Social History Social History   Tobacco Use  . Smoking status: Former Smoker    Types: Cigarettes    Last attempt to quit: 1984    Years since quitting: 35.5  . Smokeless tobacco: Never Used  Substance Use Topics  . Alcohol use: Yes    Alcohol/week: 0.0 oz    Comment: occassionally  . Drug use: Never    Review of Systems Constitutional: Positive for fever Eyes: No visual changes. ENT: No sore throat. Cardiovascular: Denies chest pain. Respiratory: Positive for shortness of breath. Gastrointestinal: No abdominal pain.  No nausea, no vomiting.  No diarrhea.  No constipation. Genitourinary: Negative for  dysuria. Musculoskeletal: Positive for knee pain Skin: Negative for rash. Neurological: Negative for headaches, focal weakness or numbness.   ____________________________________________   PHYSICAL EXAM:  VITAL SIGNS: ED Triage Vitals  Enc Vitals Group     BP      Pulse      Resp      Temp      Temp src      SpO2      Weight      Height      Head Circumference      Peak Flow      Pain Score      Pain Loc      Pain Edu?  Excl. in [place]?     Constitutional: Alert and oriented x4 appears obviously short of breath with mild accessory muscle use Eyes: PERRL EOMI. Head: Atraumatic. Nose: No congestion/rhinnorhea. Mouth/Throat: No trismus Neck: No stridor.   Cardiovascular: Tachycardic with grossly normal heart sounds Respiratory: Increased respiratory effort although lungs are clear Gastrointestinal: Soft nontender Musculoskeletal: Right leg is swollen and somewhat erythematous.  Surgical site appears healthy with no evidence of infection Neurologic:  Normal speech and language. No gross focal neurologic deficits are appreciated. Skin:  Skin is warm, dry and intact. No rash noted. Psychiatric: Mood and affect are normal. Speech and behavior are normal.    ____________________________________________   DIFFERENTIAL includes but not limited to  Pulmonary embolism, pneumonia, sepsis, bacteremia, knee infection ____________________________________________   LABS (all labs ordered are listed, but only abnormal results are displayed)  Labs Reviewed  COMPREHENSIVE METABOLIC PANEL - Abnormal; Notable for the following components:      Result Value   Sodium 134 (*)    Glucose, Bld 103 (*)    Albumin 3.3 (*)    Alkaline Phosphatase 20 (*)    Total Bilirubin 1.6 (*)    All other components within normal limits  TROPONIN I - Abnormal; Notable for the following components:   Troponin I 0.03 (*)    All other components within normal limits  CBC WITH  DIFFERENTIAL/PLATELET - Abnormal; Notable for the following components:   WBC 11.3 (*)    RBC 3.65 (*)    Hemoglobin 11.7 (*)    HCT 34.3 (*)    Neutro Abs 9.5 (*)    Lymphs Abs 0.9 (*)    All other components within normal limits  PROTIME-INR - Abnormal; Notable for the following components:   Prothrombin Time 19.6 (*)    All other components within normal limits  URINALYSIS, ROUTINE W REFLEX MICROSCOPIC - Abnormal; Notable for the following components:   Color, Urine STRAW (*)    APPearance CLEAR (*)    All other components within normal limits  BRAIN NATRIURETIC PEPTIDE - Abnormal; Notable for the following components:   B Natriuretic Peptide 518.0 (*)    All other components within normal limits  BLOOD GAS, VENOUS - Abnormal; Notable for the following components:   Bicarbonate 28.9 (*)    Acid-Base Excess 2.7 (*)    All other components within normal limits  CULTURE, BLOOD (ROUTINE X 2)  CULTURE, BLOOD (ROUTINE X 2)  URINE CULTURE  LACTIC ACID, PLASMA  LACTIC ACID, PLASMA  LIPASE, BLOOD  PROCALCITONIN  BASIC METABOLIC PANEL  CBC    Elevated white count concerning for infection __________________________________________  EKG  ED ECG REPORT I, Darel Hong, the attending physician, personally viewed and interpreted this ECG.  Date: 11/16/2017 EKG Time:  Rate: 118 Rhythm: Atrial fibrillation with rapid ventricular response  QRS Axis: Rightward axis which is new Intervals: normal ST/T Wave abnormalities: normal Narrative Interpretation: no evidence of acute ischemia  ____________________________________________  RADIOLOGY  CT angiogram reviewed by me with no no clot ____________________________________________   PROCEDURES  Procedure(s) performed: no  .Critical Care Performed by: Darel Hong, MD Authorized by: Darel Hong, MD   Critical care provider statement:    Critical care time (minutes):  45   Critical care time was exclusive of:   Separately billable procedures and treating other patients   Critical care was necessary to treat or prevent imminent or life-threatening deterioration of the following conditions:  Respiratory failure   Critical care was time spent personally  by me on the following activities:  Development of treatment plan with patient or surrogate, discussions with consultants, evaluation of patient's response to treatment, examination of patient, obtaining history from patient or surrogate, ordering and performing treatments and interventions, ordering and review of laboratory studies, ordering and review of radiographic studies, pulse oximetry, re-evaluation of patient's condition and review of old charts    Critical Care performed: yes  ____________________________________________   INITIAL IMPRESSION / [place] / ED COURSE  Pertinent labs & imaging results that were available during my care of the patient were reviewed by me and considered in my medical decision making (see chart for details).   On arrival the patient appears obviously short of breath.  She is tachycardic to the 1 teens and atrial fibrillation and febrile to 101.4.  3 days ago she had surgery on her right knee and she normally takes Pradaxa for atrial fibrillation and a remote stroke however she has been off her anticoagulation for surgery.  She has no cough but I have to be concerned about hospital associated pneumonia or pulmonary embolism.  1 L of fluids for now, Tylenol for her fever, broad-spectrum antibiotics, a presumptive dose of Lovenox given her hypoxia to 84% on room air, and she will require a CT scan of her chest.  Deferring on 30 cc/kg of IV fluid as her blood pressure is currently normal and I do not yet have a lactate greater than 4.     Fortunately the patient's CT scan is reassuring.  Unclear etiology of her symptoms but she requires inpatient admission for broad-spectrum antibiotics and management of her  respiratory failure and sepsis. ____________________________________________   FINAL CLINICAL IMPRESSION(S) / ED DIAGNOSES  Final diagnoses:  Acute respiratory failure with hypoxia ([place])  Sepsis, due to unspecified organism [facility])      NEW MEDICATIONS STARTED DURING THIS VISIT:  Current Discharge Medication List       Note:  This document was prepared using Dragon voice recognition software and may include unintentional dictation errors.     Darel Hong, MD 11/17/17 [phone]

## 2017-11-16 NOTE — ED Notes (Signed)
Patient transported to CT 

## 2017-11-16 NOTE — Progress Notes (Addendum)
  Subjective: 3 Days Post-Op Procedure(s) (LRB): TOTAL KNEE ARTHROPLASTY (Right) Patient reports pain as 7 on 0-10 scale.   Patient is well, and has had no acute complaints or problems Plan is for discharge to SNF today. Negative for chest pain and shortness of breath Fever: Mild fever with tachycardia last night.  Denies any cough or abdominal pain. Gastrointestinal:Negative for nausea and vomiting  Objective: Vital signs in last 24 hours: Temp:  [98.9 F (37.2 C)-99.2 F (37.3 C)] 99.1 F (37.3 C) (07/12 0842) Pulse Rate:  [98-108] 108 (07/12 0842) Resp:  [18-19] 18 (07/12 0842) BP: (114-145)/(54-61) 142/56 (07/12 0842) SpO2:  [90 %-92 %] 90 % (07/12 0842)  Intake/Output from previous day:  Intake/Output Summary (Last 24 hours) at 11/16/2017 0952 Last data filed at 11/15/2017 1900 Gross per 24 hour  Intake 360 ml  Output -  Net 360 ml    Intake/Output this shift: No intake/output data recorded.  Labs: Recent Labs    11/14/17 0526 11/15/17 0611 11/16/17 0459  HGB 12.3 11.0* 11.3*   Recent Labs    11/15/17 0611 11/16/17 0459  WBC 12.6* 10.8  RBC 3.47* 3.55*  HCT 32.4* 33.3*  PLT 152 146*   Recent Labs    11/15/17 0611 11/16/17 0459  NA 139 136  K 3.6 4.1  CL 107 100  CO2 26 28  BUN 14 13  CREATININE 0.80 0.86  GLUCOSE 139* 114*  CALCIUM 8.9 8.9   No results for input(s): LABPT, INR in the last 72 hours.   EXAM General - Patient is Alert, Appropriate and Oriented Extremity - ABD soft Neurovascular intact Sensation intact distally Intact pulses distally Dorsiflexion/Plantar flexion intact Incision: scant drainage No cellulitis present  Negative Homan's to the right leg. Dressing/Incision - Blood tinged drainage this AM. Motor Function - intact, moving foot and toes well on exam.   Past Medical History:  Diagnosis Date  . Aortic valve disorder   . Arthritis   . Asthma   . Atrial fibrillation ([place])   . Breast cancer ([place]) 2004   LT  MASTECTOMY  . Fibrocystic disease of both breasts   . GERD (gastroesophageal reflux disease)   . Hx of colonic polyps   . Hyperlipidemia   . Hypertension   . Malignant neoplasm of breast ([place])    left  . Osteoarthritis of right knee 2019  . Stroke [facility]) 2008   right peripheral vision is gone    Assessment/Plan: 3 Days Post-Op Procedure(s) (LRB): TOTAL KNEE ARTHROPLASTY (Right) Active Problems:   Status post total knee replacement using cement, right  Estimated body mass index is 29.1 kg/m as calculated from the following:   Height as of this encounter: 5\' 1"  (1.549 m).   Weight as of this encounter: 69.9 kg (154 lb). Up with therapy   Labs reviewed this morning.  WBC 10.8 Mild fevers, no SOB, chest pain, abdominal pain or headache. Up with therapy today. Pt has had a BM and is urinating well. Plan at this time will be for discharge to SNF today following PT.  DVT Prophylaxis - Foot Pumps, TED hose and Pradaxa Weight-Bearing as tolerated to right leg  J. Cameron Proud, PA-C [facility] Orthopaedic Surgery 11/16/2017, 9:52 AM

## 2017-11-16 NOTE — Progress Notes (Signed)
Purpose of Encounter Sepsis, code status discussion  Parties in Attendance Patient and HCPOA Colleen [name],Colleen [name]( Family Friend)  Patients Decisional capacity Alert and oriented.  Able to make medical decisions  Patient is alert and oriented.  Able to make own medical decisions.  She has a Camera operator. Discussed regarding acute admission for sepsis and critical nature.  Patient understands.  We also discussed regarding CODE STATUS and patient want CPR and intubation attempted.  But does not want to be on prolonged artificial life support.  Time spent 18 minutes

## 2017-11-16 NOTE — Progress Notes (Signed)
Called report to [place] at [facility]. Answered all questions. Called EMS for transport

## 2017-11-16 NOTE — Progress Notes (Signed)
Pharmacy Antibiotic Note  Colleen [name] is a 84 y.o. female admitted on 11/16/2017 with sepsis.  Pharmacy has been consulted for Vancomycin and Cefepime dosing.  Plan: Vancomycin 1g IV every 18 hours.  Goal trough 15-20 mcg/mL. Will check a trough level prior to 5th dose.  Cefepime 2g IV q12h  Height: 5\' 1"  (154.9 cm) Weight: 154 lb (69.9 kg) IBW/kg (Calculated) : 47.8  Temp (24hrs), Avg:99.4 F (37.4 C), Min:98.5 F (36.9 C), Max:101.4 F (38.6 C)  Recent Labs  Lab 11/14/17 0526 11/15/17 0611 11/16/17 0459 11/16/17 1503  WBC 11.5* 12.6* 10.8 11.3*  CREATININE 1.04* 0.80 0.86 0.80  LATICACIDVEN  --   --   --  1.4    Estimated Creatinine Clearance: 46.8 mL/min (by C-G formula based on SCr of 0.8 mg/dL).    Allergies  Allergen Reactions  . Ibuprofen Swelling  . Codeine Anxiety    Hallucination.    Antimicrobials this admission: Vancomycin 7/12 >>  Cefepime 7/12 >>   Thank you for allowing pharmacy to be a part of this patient's care.  Paulina Fusi, PharmD, BCPS 11/16/2017 6:09 PM

## 2017-11-16 NOTE — ED Notes (Signed)
Ultrasound at bedside

## 2017-11-16 NOTE — Progress Notes (Signed)
Physical Therapy Treatment Patient Details Name: Colleen [name] MRN: [id number] DOB: 22-Dec-1930 Today's Date: 11/16/2017    History of Present Illness Patient underwent right TKR without reported post-op complications. PT evaluation performed on POD#0. PMH includes asthma, CVA, aortic valve disorder, breast cancer, GERD, hyperlipidemia and hypertension.    PT Comments    Pt seen this morning and states that she is cautiously optimistic that the pain is better today than yesterday however continues to complain of 4/10 pain that increased during session most notably with weight bearing. Pt instructed in there-ex which she tolerated well. Pt aarom of R knee 4-87. Pt requires similar assistance with bed mobility, transfers, and amb as previous sessions. Pts R LE demonstrates increased pitting edema in foot and ankle compared bilaterally. Pts calf is red however not warm to touch. Squeezing of calf increases pain. Nursing notified of edema and redness. Pt declines further gait training during session due to increased pain with weight bearing. Pt could benefit from continued skilled therapy at this time to improve deficits toward PLOF. PT will continue to work with pt BID while admitted. D/c recommendations continue to be SNF.   Follow Up Recommendations  SNF     Equipment Recommendations  Rolling walker with 5" wheels;3in1 (PT)    Recommendations for Other Services       Precautions / Restrictions Precautions Precautions: Fall Restrictions Weight Bearing Restrictions: Yes RLE Weight Bearing: Weight bearing as tolerated    Mobility  Bed Mobility Overal bed mobility: Needs Assistance Bed Mobility: Supine to Sit     Supine to sit: Min assist     General bed mobility comments: pt requires min assistance to initiate movement of R LE to move toward EOB. required decreasing amount of assistance throughout movement.  Transfers Overall transfer level: Needs assistance Equipment used:  Rolling walker (2 wheeled) Transfers: Sit to/from Stand Sit to Stand: Min assist         General transfer comment: pt requires min assist and verbal cuing for safe hand placement during transfer. Requires min assist at beginning of movement decreasing throughout transfer. Pt able to maintain upright posture with CGA once standing.  Ambulation/Gait Ambulation/Gait assistance: Min guard Gait Distance (Feet): 3 Feet Assistive device: Rolling walker (2 wheeled)   Gait velocity: pt demonstrates slow shuffling gait with amb requiring CGA and RW. Pt states that bearing weight drastically increases pain and declines further gait training during this session.       Stairs             Wheelchair Mobility    Modified Rankin (Stroke Patients Only)       Balance                                            Cognition Arousal/Alertness: Awake/alert Behavior During Therapy: WFL for tasks assessed/performed Overall Cognitive Status: Within Functional Limits for tasks assessed                                        Exercises Total Joint Exercises Goniometric ROM: pt R aarom 4-87 Other Exercises Other Exercises: pt instructed in ther-ex to R LE x15 reps each including SLR, ankle pumps, LAQ, quad sets, hip abd, hip adduction isometrics. Pt states that she is unable to initiate motion  for SLR and LAQ, min assist to initiate movement decreased to CGA during exercise. pt attempted to instruct patient in seated marching however due to inability to initiate motion pt unable to perform.    General Comments        Pertinent Vitals/Pain Pain Assessment: 0-10 Pain Score: 4  Pain Location: R knee Pain Descriptors / Indicators: Operative site guarding Pain Intervention(s): Limited activity within patient's tolerance;Monitored during session;Repositioned;Ice applied    Home Living                      Prior Function            PT Goals  (current goals can now be found in the care plan section) Acute Rehab PT Goals Patient Stated Goal: "I think I really need to go to rehab, I'm worried about being alone at night." PT Goal Formulation: With patient Time For Goal Achievement: 11/27/17 Potential to Achieve Goals: Good Progress towards PT goals: Progressing toward goals    Frequency    BID      PT Plan Current plan remains appropriate    Co-evaluation              AM-PAC PT "6 Clicks" Daily Activity  Outcome Measure  Difficulty turning over in bed (including adjusting bedclothes, sheets and blankets)?: A Little Difficulty moving from lying on back to sitting on the side of the bed? : Unable Difficulty sitting down on and standing up from a chair with arms (e.g., wheelchair, bedside commode, etc,.)?: Unable Help needed moving to and from a bed to chair (including a wheelchair)?: A Lot Help needed walking in hospital room?: A Lot Help needed climbing 3-5 steps with a railing? : A Lot 6 Click Score: 11    End of Session Equipment Utilized During Treatment: Gait belt Activity Tolerance: Patient limited by pain Patient left: in chair;with call bell/phone within reach;with chair alarm set;with SCD's reapplied Nurse Communication: Mobility status;Other (comment)(increased swelling and redness in distal R LE) PT Visit Diagnosis: Unsteadiness on feet (R26.81);Muscle weakness (generalized) (M62.81);Pain Pain - Right/Left: Right Pain - part of body: Knee     Time: 8889-1694 PT Time Calculation (min) (ACUTE ONLY): 22 min  Charges:                       G Codes:       Colleen [name], SPT    Colleen [name] 11/16/2017, 10:58 AM

## 2017-11-16 NOTE — ED Triage Notes (Signed)
Patient arrived by EMS from [facility] for fever and SOB. Patient was just d/c from [facility] for R knee replacement approximately 30 minutes ago and when arriving at [facility] had fever 101, 89% RA, R calf pain, and increased WOB. HX L breast removal. Patient was placed on 2L O2 and sats increased to 95%.

## 2017-11-16 NOTE — Discharge Summary (Signed)
Physician Discharge Summary  Patient ID: Colleen [name] MRN: [id number] DOB/AGE: [birth year] 84 y.o.  Admit date: 11/13/2017 Discharge date: 11/16/2017  Admission Diagnoses:  PRIMARY OSTEOARTHRITIS OF RIGHT KNEE  Discharge Diagnoses: Patient Active Problem List   Diagnosis Date Noted  . Status post total knee replacement using cement, right 11/13/2017    Past Medical History:  Diagnosis Date  . Aortic valve disorder   . Arthritis   . Asthma   . Atrial fibrillation ([place])   . Breast cancer ([place]) 2004   LT MASTECTOMY  . Fibrocystic disease of both breasts   . GERD (gastroesophageal reflux disease)   . Hx of colonic polyps   . Hyperlipidemia   . Hypertension   . Malignant neoplasm of breast ([place])    left  . Osteoarthritis of right knee 2019  . Stroke [facility]) 2008   right peripheral vision is gone   Transfusion: None.   Consultants (if any):   Discharged Condition: Improved  Hospital Course: MAGENTA SCHMIESING is an 84 y.o. female who was admitted 11/13/2017 with a diagnosis of right knee osteoarthritis and went to the operating room on 11/13/2017 and underwent the above named procedures.    Surgeries: Procedure(s): TOTAL KNEE ARTHROPLASTY on 11/13/2017 Patient tolerated the surgery well. Taken to PACU where she was stabilized and then transferred to the orthopedic floor.  Continued Pradaxa 150mg  twice daily. Foot pumps applied bilaterally at 80 mm. Heels elevated on bed with rolled towels. No evidence of DVT. Negative [place]. Physical therapy started on day #1 for gait training and transfer. OT started day #1 for ADL and assisted devices.  Patient's IV was removed on POD1, Foley was removed shortly following surgery.  Implants: Right TKA using all-cemented [employer] Vanguard system with a 62.5 mm PCR femur, a 67 mm tibial tray with a 10 mm AS E-poly insert, and a 31 x 8 mm all-poly 3-pegged domed patella.  She was given perioperative antibiotics:  Anti-infectives (From admission,  onward)   Start     Dose/Rate Route Frequency Ordered Stop   11/13/17 1630  ceFAZolin (ANCEF) IVPB 2g/100 mL premix     2 g 200 mL/hr over 30 Minutes Intravenous Every 6 hours 11/13/17 1413 11/14/17 0608   11/13/17 0852  ceFAZolin (ANCEF) 2-4 GM/100ML-% IVPB    Note to Pharmacy:  Dewayne Hatch   : cabinet override      11/13/17 0852 11/13/17 1037   11/13/17 0245  ceFAZolin (ANCEF) IVPB 2g/100 mL premix     2 g 200 mL/hr over 30 Minutes Intravenous  Once 11/13/17 0233 11/13/17 1037    .  She was given sequential compression devices, early ambulation, and Pradaxa for DVT prophylaxis.  She benefited maximally from the hospital stay and there were no complications.    Recent vital signs:  Vitals:   11/15/17 2359 11/16/17 0842  BP: 138/61 (!) 142/56  Pulse: (!) 104 (!) 108  Resp: 19 18  Temp: 99.2 F (37.3 C) 99.1 F (37.3 C)  SpO2: 92% 90%    Recent laboratory studies:  Lab Results  Component Value Date   HGB 11.3 (L) 11/16/2017   HGB 11.0 (L) 11/15/2017   HGB 12.3 11/14/2017   Lab Results  Component Value Date   WBC 10.8 11/16/2017   PLT 146 (L) 11/16/2017   Lab Results  Component Value Date   INR 1.00 11/13/2017   Lab Results  Component Value Date   NA 136 11/16/2017   K 4.1 11/16/2017  CL 100 11/16/2017   CO2 28 11/16/2017   BUN 13 11/16/2017   CREATININE 0.86 11/16/2017   GLUCOSE 114 (H) 11/16/2017    Discharge Medications:   Allergies as of 11/16/2017      Reactions   Ibuprofen Swelling   Codeine Anxiety   Hallucination.      Medication List    TAKE these medications   atenolol 50 MG tablet Commonly known as:  TENORMIN Take 50 mg by mouth 2 (two) times daily.   b complex vitamins tablet Take 1 tablet by mouth daily.   Biotin 1000 MCG tablet Take 1,000 mcg by mouth daily.   CALCIUM 600+D3 PO Take 1 tablet by mouth daily.   cetirizine 10 MG tablet Commonly known as:  ZYRTEC Take 10 mg by mouth daily.   FISH OIL PO Take 1 capsule  by mouth daily.   fluticasone 50 MCG/ACT nasal spray Commonly known as:  FLONASE Place 2 sprays into both nostrils daily.   folic acid 161 MCG tablet Commonly known as:  FOLVITE Take 400 mcg by mouth daily.   furosemide 20 MG tablet Commonly known as:  LASIX Take 20 mg by mouth daily.   lisinopril 40 MG tablet Commonly known as:  PRINIVIL,ZESTRIL Take 40 mg by mouth 2 (two) times daily.   montelukast 10 MG tablet Commonly known as:  SINGULAIR Take 10 mg by mouth at bedtime.   multivitamin with minerals Tabs tablet Take 1 tablet by mouth daily.   NIFEdipine 30 MG 24 hr tablet Commonly known as:  PROCARDIA-XL/ADALAT-CC/NIFEDICAL-XL Take 30 mg by mouth daily.   OSTEO BI-FLEX ADV TRIPLE ST PO Take 1 tablet by mouth 2 (two) times daily.   oxyCODONE 5 MG immediate release tablet Commonly known as:  Oxy IR/ROXICODONE Take 1-2 tablets (5-10 mg total) by mouth every 4 (four) hours as needed for moderate pain (pain score 4-6).   PRADAXA 150 MG Caps capsule Generic drug:  dabigatran Take 150 mg by mouth 2 (two) times daily.   rosuvastatin 10 MG tablet Commonly known as:  CRESTOR Take 10 mg by mouth at bedtime.   spironolactone 25 MG tablet Commonly known as:  ALDACTONE Take 25 mg by mouth daily.   traMADol 50 MG tablet Commonly known as:  ULTRAM Take 1-2 tablets (50-100 mg total) by mouth every 6 (six) hours as needed for moderate pain.   vitamin C 500 MG tablet Commonly known as:  ASCORBIC ACID Take 1,000 mg by mouth daily.   VITAMIN D-400 400 units Tabs tablet Generic drug:  cholecalciferol Take 400 Units by mouth daily.   vitamin E 400 UNIT capsule Take 400 Units by mouth daily.            Durable Medical Equipment  (From admission, onward)        Start     Ordered   11/13/17 1414  DME Walker rolling  Once    Question:  Patient needs a walker to treat with the following condition  Answer:  Status post total knee replacement using cement, right    11/13/17 1413   11/13/17 1414  DME 3 n 1  Once     11/13/17 1413   11/13/17 1414  DME Bedside commode  Once    Question:  Patient needs a bedside commode to treat with the following condition  Answer:  Status post total knee replacement using cement, right   11/13/17 1413      Diagnostic Studies: Dg Chest 2 View  Result  Date: 11/01/2017 CLINICAL DATA:  Preop for knee replacement EXAM: CHEST - 2 VIEW COMPARISON:  07/22/2007 FINDINGS: The heart is borderline enlarged. Normal vascularity. Clear lungs. No pneumothorax or pleural effusion. IMPRESSION: Borderline cardiomegaly without decompensation. Electronically Signed   By: Marybelle Killings M.D.   On: 11/01/2017 08:27   Dg Knee Right Port  Result Date: 11/13/2017 CLINICAL DATA:  Post right total knee replacement EXAM: PORTABLE RIGHT KNEE - 1-2 VIEW COMPARISON:  MR right knee of 10/09/2016 FINDINGS: The right femoral and tibial components of the total knee replacement are in good position with no complicating features. There is air in the joint space postoperatively. The only questionable abnormality is just above the proximal extent of the femoral prosthesis there is loss of cortical margin which may be postoperative in nature. Correlate with surgical change. IMPRESSION: 1. Right total knee replacement components in good position. 2. Some loss of cortex just above the proximal extent of the femoral component of the knee replacement. Question significance. Electronically Signed   By: Ivar Drape M.D.   On: 11/13/2017 13:19   Mm 3d Screen Breast Uni Right  Result Date: 10/29/2017 CLINICAL DATA:  Screening. EXAM: DIGITAL SCREENING UNILATERAL RIGHT MAMMOGRAM WITH CAD AND TOMO COMPARISON:  Previous exam(s). ACR Breast Density Category b: There are scattered areas of fibroglandular density. FINDINGS: The patient has had a left mastectomy. There are no findings suspicious for malignancy. Images were processed with CAD. IMPRESSION: No mammographic evidence of  malignancy. A result letter of this screening mammogram will be mailed directly to the patient. RECOMMENDATION: Screening mammogram in one year.  (Code:SM-R-20M) BI-RADS CATEGORY  1: Negative. Electronically Signed   By: Nolon Nations M.D.   On: 10/29/2017 17:33   Disposition: Plan is for discharge to [facility] later today following therapy.   Contact information for follow-up providers    Lattie Corns, PA-C Follow up in 14 day(s).   Specialty:  Physician Assistant Why:  Electa Sniff information: [place] [address] [phone]            Contact information for after-discharge care    Destination    [facility] .   Service:  Skilled Nursing Contact information: [address] [place] [phone]                  Signed: Judson Roch PA-C 11/16/2017, 9:56 AM

## 2017-11-16 NOTE — Discharge Instructions (Signed)

## 2017-11-16 NOTE — Clinical Social Work Placement (Signed)
   CLINICAL SOCIAL WORK PLACEMENT  NOTE  Date:  11/16/2017  Patient Details  Name: Colleen [name] MRN: [id number] Date of Birth: 1930-11-11  Clinical Social Work is seeking post-discharge placement for this patient at the [place] level of care (*CSW will initial, date and re-position this form in  chart as items are completed):  Yes   Patient/family provided with [facility] Work Department's list of facilities offering this level of care within the geographic area requested by the patient (or if unable, by the patient's family).  Yes   Patient/family informed of their freedom to choose among providers that offer the needed level of care, that participate in Medicare, Medicaid or managed care program needed by the patient, have an available bed and are willing to accept the patient.  Yes   Patient/family informed of [facility]'s ownership interest in [facility] and [facility], as well as of the fact that they are under no obligation to receive care at these facilities.  PASRR submitted to EDS on 11/13/17     PASRR number received on 11/13/17     Existing PASRR number confirmed on       FL2 transmitted to all facilities in geographic area requested by pt/family on 11/14/17     FL2 transmitted to all facilities within larger geographic area on       Patient informed that his/her managed care company has contracts with or will negotiate with certain facilities, including the following:        Yes   Patient/family informed of bed offers received.  Patient chooses bed at [facility] )     Physician recommends and patient chooses bed at      Patient to be transferred to [facility] ) on 11/16/17.  Patient to be transferred to facility by [facility] )     Patient family notified on 11/16/17 of transfer.  Name of family member notified:  (Patient's neighbor Barnabas Lister is aware of D/C today. )     PHYSICIAN       Additional  Comment:    _______________________________________________ Dayne Chait, Veronia Beets, LCSW 11/16/2017, 11:16 AM

## 2017-11-16 NOTE — Care Management Important Message (Signed)
Important Message  Patient Details  Name: Colleen [name] MRN: [id number] Date of Birth: 1930-12-24   Medicare Important Message Given:  Yes    Juliann Pulse A Garritt Molyneux 11/16/2017, 11:07 AM

## 2017-11-16 NOTE — Progress Notes (Signed)
Patient is medically stable for D/C to [facility] today. Per [facility] admissions coordinator at [facility] patient can come today to room 204-A. RN will call report at [phone] and arrange EMS for transport. Clinical Education officer, museum (CSW) sent D/C orders to [facility] [employer] [facility] via [employer]. Patient is aware of above. CSW contacted patient's neighbor Barnabas Lister and made him aware of above. Please reconsult if future social work needs arise. CSW signing off.   [employer], LCSW [phone]

## 2017-11-17 LAB — BASIC METABOLIC PANEL
ANION GAP: 10 (ref 5–15)
BUN: 11 mg/dL (ref 8–23)
CALCIUM: 8.5 mg/dL — AB (ref 8.9–10.3)
CHLORIDE: 99 mmol/L (ref 98–111)
CO2: 30 mmol/L (ref 22–32)
Creatinine, Ser: 0.89 mg/dL (ref 0.44–1.00)
GFR calc non Af Amer: 57 mL/min — ABNORMAL LOW (ref >60)
Glucose, Bld: 115 mg/dL — ABNORMAL HIGH (ref 70–99)
Potassium: 3.4 mmol/L — ABNORMAL LOW (ref 3.5–5.1)
Sodium: 139 mmol/L (ref 135–145)

## 2017-11-17 LAB — CBC
HCT: 31.2 % — ABNORMAL LOW (ref 35.0–47.0)
HEMOGLOBIN: 10.8 g/dL — AB (ref 12.0–16.0)
MCH: 32.4 pg (ref 26.0–34.0)
MCHC: 34.6 g/dL (ref 32.0–36.0)
MCV: 93.4 fL (ref 80.0–100.0)
Platelets: 163 10*3/uL (ref 150–440)
RBC: 3.34 MIL/uL — AB (ref 3.80–5.20)
RDW: 12.8 % (ref 11.5–14.5)
WBC: 9.2 10*3/uL (ref 3.6–11.0)

## 2017-11-17 MED ORDER — POTASSIUM CHLORIDE CRYS ER 20 MEQ PO TBCR
20.0000 meq | EXTENDED_RELEASE_TABLET | Freq: Once | ORAL | Status: AC
  Administered 2017-11-17: 20 meq via ORAL
  Filled 2017-11-17: qty 1

## 2017-11-17 NOTE — Progress Notes (Signed)
Patient a&o, vss. Resting in bed. No complaints. Call bell in reach. Bed on low setting. Continue to monitor.

## 2017-11-17 NOTE — Plan of Care (Signed)

## 2017-11-17 NOTE — Clinical Social Work Note (Signed)
The patient admitted from [facility] and will return when stable. The CSW will input the full assessment when able.  Santiago Bumpers, MSW, Latanya Presser [phone]

## 2017-11-17 NOTE — Evaluation (Addendum)
Physical Therapy Evaluation Patient Details Name: Colleen [name] MRN: [id number] DOB: Oct 12, 1930 Today's Date: 11/17/2017   History of Present Illness  Pt re-admitted to hospital after brief transport to [facility] after R TKR (11/13/17) without reported post-op complications. Pt presented to ED with fever and elevated HR. Nursing reported RLE doppler negative, chest imaging negative but HR has been elevated and pt on 2LO2. Nursing cleared PT to work with pt prior to eval. PMH includes asthma, CVA, aortic valve disorder, breast cancer, GERD, hyperlipidemia and hypertension.  Clinical Impression  Pt presented to ED from [facility], with recent d/c from [facility] s/p R TKA on 11/13/17. Pt reports she wants to go back to [place], as she pt lives alone. Pt was limited today during session 2/2 elevated resting HR (100s-110s) and incr. To 120s sitting EOB. Pt had SpO2 [place] doffed upon entering room and SaO2 decr. To 87% without 2LO2. SaO2 incr. To >/=98% with SpO2 [place] donned. Pt also limited by pain today but willing to trial supine<>sit and exercises. PT did not attempt STS txfs or amb. Today 2/2 elevated resting HR. Pt required min A to guide RLE on/off EOB and min A to S during all RLE exercises. The following impairments were noted upon exam: decr. Strength, impaired balance, pain, decr. AROM, and gait deviations likely based on hx of recent R TKA. PT recommending d/c to SNF at this time. Pt would benefit from skilled PT to address above deficits and promote optimal return to PLOF.    Follow Up Recommendations SNF    Equipment Recommendations       Recommendations for Other Services       Precautions / Restrictions Precautions Precautions: Fall Restrictions Weight Bearing Restrictions: Yes RLE Weight Bearing: Weight bearing as tolerated      Mobility  Bed Mobility Overal bed mobility: Needs Assistance Bed Mobility: Supine to Sit;Sit to Supine     Supine to sit: Min assist Sit to supine: Min assist   General bed mobility comments: Min A to assist RLE on/off bed. Cues for hand placement and to improve anterior weight shifting to scoot towards EOB. HR in the 100's to 110's at rest, incr. to 120's EOB.  Transfers                 General transfer comment: Did not attempt 2/2 pt's elevated resting HR, incr. to 120's sitting EOB.  Ambulation/Gait Ambulation/Gait assistance: (Not attempted)              Stairs            Wheelchair Mobility    Modified Rankin (Stroke Patients Only)       Balance Overall balance assessment: (would likely need assist to stand) Sitting-balance support: No upper extremity supported Sitting balance-Leahy Scale: Good                                       Pertinent Vitals/Pain Pain Assessment: 0-10 Pain Score: (4-5/10 at rest) Pain Location: R knee Pain Descriptors / Indicators: Aching;Operative site guarding Pain Intervention(s): Limited activity within patient's tolerance;Ice applied    Home Living Family/patient expects to be discharged to:: Skilled nursing facility Living Arrangements: Alone Available Help at Discharge: (no family to assist) Type of Home: House Home Access: Stairs to enter Entrance Stairs-Rails: Right Entrance Stairs-Number of Steps: 1 Home Layout: One level Home Equipment: Cane - single point  Prior Function Level of Independence: Independent         Comments: Previously independent with all ADLs/IADLs. Ambulates intermittently with single point cane on uneven surfaces. One fall in the last 12 months due to vasovagal episode on commode.     Hand Dominance   Dominant Hand: Right    Extremity/Trunk Assessment   Upper Extremity Assessment Upper Extremity Assessment: Overall WFL for tasks assessed    Lower Extremity Assessment Lower Extremity Assessment: RLE deficits/detail RLE Deficits / Details: Pt able to perform LAQ in limited range and required assist to obtain full  extension, she was able to perform SAQ, heel slide, hip add/abd with assist only at heel to reduce friction. Pt denied N/T. Pt able to perform full R ankle plantarflexion/dorsiflexion.       Communication   Communication: No difficulties  Cognition Arousal/Alertness: Awake/alert Behavior During Therapy: WFL for tasks assessed/performed Overall Cognitive Status: Within Functional Limits for tasks assessed                                        General Comments      Exercises Total Joint Exercises Ankle Circles/Pumps: Both;20 reps;Seated;AROM Quad Sets: AROM;Right;10 reps;Supine Short Arc Quad: Right;10 reps;AROM;Supine Heel Slides: Strengthening;Right;AAROM;10 reps;Supine Hip ABduction/ADduction: Strengthening;Right;10 reps;Supine Straight Leg Raises: Strengthening;Right;5 reps;Supine Long Arc Quad: Strengthening;AAROM;5 reps;Seated Other Exercises Other Exercises: Pt required cues and demo for technique and intermittent assist 2/2 R knee pain.    Assessment/Plan    PT Assessment Patient needs continued PT services  PT Problem List Decreased strength;Decreased range of motion;Decreased activity tolerance;Decreased balance;Decreased mobility;Decreased knowledge of use of DME       PT Treatment Interventions DME instruction;Gait training;Stair training;Functional mobility training;Therapeutic activities;Balance training;Therapeutic exercise;Neuromuscular re-education;Patient/family education;Manual techniques    PT Goals (Current goals can be found in the Care Plan section)  Acute Rehab PT Goals Patient Stated Goal: I need to get to rehab PT Goal Formulation: With patient Time For Goal Achievement: 12/01/17 Potential to Achieve Goals: Good    Frequency 7X/week   Barriers to discharge Decreased caregiver support Pt lives alone    Co-evaluation               AM-PAC PT "6 Clicks" Daily Activity  Outcome Measure Difficulty turning over in bed  (including adjusting bedclothes, sheets and blankets)?: None Difficulty moving from lying on back to sitting on the side of the bed? : A Little Difficulty sitting down on and standing up from a chair with arms (e.g., wheelchair, bedside commode, etc,.)?: A Little Help needed moving to and from a bed to chair (including a wheelchair)?: A Lot Help needed walking in hospital room?: A Lot Help needed climbing 3-5 steps with a railing? : A Lot 6 Click Score: 16    End of Session Equipment Utilized During Treatment: Oxygen Activity Tolerance: Patient limited by pain Patient left: in bed;with call bell/phone within reach;with bed alarm set Nurse Communication: Mobility status;Other (comment)(elevated HR, and CNA headed to room to place ext. cath) PT Visit Diagnosis: Unsteadiness on feet (R26.81);Muscle weakness (generalized) (M62.81);Pain;Other abnormalities of gait and mobility (R26.89) Pain - Right/Left: Right Pain - part of body: Knee    Time: 3532-9924 PT Time Calculation (min) (ACUTE ONLY): 25 min   Charges:   PT Evaluation $PT Eval Low Complexity: 1 Low PT Treatments $Therapeutic Exercise: 8-22 mins   PT G Codes:  Geoffry Paradise, PT,DPT 11/17/17 10:07 AM    Esmerelda Finnigan L 11/17/2017, 10:02 AM

## 2017-11-17 NOTE — Progress Notes (Signed)
[place] [facility] [place] NAME: Colleen [name]    MR#:  [id number]  DATE OF BIRTH:  Aug 19, 1930  SUBJECTIVE:  CHIEF COMPLAINT:   Chief Complaint  Patient presents with  . Fever  . Shortness of Breath   The patient had a right knee pain and generalized weakness. REVIEW OF SYSTEMS:  Review of Systems  Constitutional: Positive for malaise/fatigue. Negative for chills and fever.  HENT: Negative for sore throat.   Eyes: Negative for blurred vision and double vision.  Respiratory: Negative for cough, hemoptysis, shortness of breath, wheezing and stridor.   Cardiovascular: Negative for chest pain, palpitations, orthopnea and leg swelling.  Gastrointestinal: Negative for abdominal pain, blood in stool, diarrhea, melena, nausea and vomiting.  Genitourinary: Negative for dysuria, flank pain and hematuria.  Musculoskeletal: Positive for joint pain. Negative for back pain.  Neurological: Negative for dizziness, sensory change, focal weakness, seizures, loss of consciousness, weakness and headaches.  Endo/Heme/Allergies: Negative for polydipsia.  Psychiatric/Behavioral: Negative for depression. The patient is not nervous/anxious.     DRUG ALLERGIES:   Allergies  Allergen Reactions  . Ibuprofen Swelling  . Codeine Anxiety    Hallucination.   VITALS:  Blood pressure 134/62, pulse (!) 113, temperature 98.3 F (36.8 C), temperature source Oral, resp. rate 18, height 5\' 1"  (1.549 m), weight 159 lb 9.6 oz (72.4 kg), SpO2 100 %. PHYSICAL EXAMINATION:  Physical Exam  Constitutional: She is oriented to person, place, and time. She appears well-developed.  HENT:  Head: Normocephalic.  Mouth/Throat: Oropharynx is clear and moist.  Eyes: Pupils are equal, round, and reactive to light. Conjunctivae and EOM are normal. No scleral icterus.  Neck: Normal range of motion. Neck supple. No JVD present. No tracheal deviation present.  Cardiovascular: Normal rate,  regular rhythm and normal heart sounds. Exam reveals no gallop.  No murmur heard. Pulmonary/Chest: Effort normal and breath sounds normal. No respiratory distress. She has no wheezes. She has no rales.  Abdominal: Soft. Bowel sounds are normal. She exhibits no distension. There is no tenderness. There is no rebound.  Musculoskeletal: She exhibits no edema or tenderness.  Right knee in dressing.  Neurological: She is alert and oriented to person, place, and time. No cranial nerve deficit.  Skin: No rash noted. No erythema.  Psychiatric: She has a normal mood and affect.   LABORATORY PANEL:  Female CBC Recent Labs  Lab 11/17/17 0710  WBC 9.2  HGB 10.8*  HCT 31.2*  PLT 163   ------------------------------------------------------------------------------------------------------------------ Chemistries  Recent Labs  Lab 11/16/17 1503 11/17/17 0710  NA 134* 139  K 3.9 3.4*  CL 98 99  CO2 27 30  GLUCOSE 103* 115*  BUN 13 11  CREATININE 0.80 0.89  CALCIUM 9.1 8.5*  AST 30  --   ALT 10  --   ALKPHOS 20*  --   BILITOT 1.6*  --    RADIOLOGY:  Ct Angio Chest Pe W/cm &/or Wo Cm  Result Date: 11/16/2017 CLINICAL DATA:  Fever and shortness of breath. Right calf pain. Recent right total knee replacement. EXAM: CT ANGIOGRAPHY CHEST WITH CONTRAST TECHNIQUE: Multidetector CT imaging of the chest was performed using the standard protocol during bolus administration of intravenous contrast. Multiplanar CT image reconstructions and MIPs were obtained to evaluate the vascular anatomy. CONTRAST:  33mL ISOVUE-370 IOPAMIDOL (ISOVUE-370) INJECTION 76% COMPARISON:  Chest x-ray dated 10/31/2017 FINDINGS: Cardiovascular: Mild cardiomegaly. No pulmonary emboli. Aortic atherosclerosis. Coronary artery calcifications. No pericardial effusion. Mediastinum/Nodes: Slight  prominence of the mucosa throughout the esophagus. No appreciable hiatal hernia. Lungs/Pleura: There is a small right pleural effusion with  slight interstitial edema dependent portions of the lobes of the right lung with a small amount of fluid along the fissures. Minimal atelectasis at the right lung base secondary to the fusion. Upper Abdomen: No acute abnormality. Extensive aortic atherosclerosis. Musculoskeletal: No acute abnormalities. Review of the MIP images confirms the above findings. IMPRESSION: 1. No pulmonary emboli. 2. Mild cardiomegaly with a small right pleural effusion and slight dependent pulmonary edema in the right lung. This could represent mild congestive heart failure. 3.  Aortic Atherosclerosis (ICD10-I70.0). 4. Slight prominence of the mucosa of esophagus diffusely. This could represent esophagitis. Electronically Signed   By: Lorriane Shire M.D.   On: 11/16/2017 15:30   US Venous Img Lower Unilateral Right  Result Date: 11/16/2017 CLINICAL DATA:  Right lower extremity pain and edema. History of right knee replacement approximately 3 days ago. History of breast cancer. Former smoker. Evaluate for DVT. EXAM: RIGHT LOWER EXTREMITY VENOUS DOPPLER ULTRASOUND TECHNIQUE: Gray-scale sonography with graded compression, as well as color Doppler and duplex ultrasound were performed to evaluate the lower extremity deep venous systems from the level of the common femoral vein and including the common femoral, femoral, profunda femoral, popliteal and calf veins including the posterior tibial, peroneal and gastrocnemius veins when visible. The superficial great saphenous vein was also interrogated. Spectral Doppler was utilized to evaluate flow at rest and with distal augmentation maneuvers in the common femoral, femoral and popliteal veins. COMPARISON:  None. FINDINGS: Contralateral Common Femoral Vein: Respiratory phasicity is normal and symmetric with the symptomatic side. No evidence of thrombus. Normal compressibility. Common Femoral Vein: No evidence of thrombus. Normal compressibility, respiratory phasicity and response to  augmentation. Saphenofemoral Junction: No evidence of thrombus. Normal compressibility and flow on color Doppler imaging. Profunda Femoral Vein: No evidence of thrombus. Normal compressibility and flow on color Doppler imaging. Femoral Vein: No evidence of thrombus. Normal compressibility, respiratory phasicity and response to augmentation. Popliteal Vein: No evidence of thrombus. Normal compressibility, respiratory phasicity and response to augmentation. Calf Veins: No evidence of thrombus. Normal compressibility and flow on color Doppler imaging. Superficial Great Saphenous Vein: No evidence of thrombus. Normal compressibility. Venous Reflux:  None. Other Findings: Note is made of an approximately 1.9 x 1.3 x 1.4 cm anechoic fluid collection with the right popliteal fossa favored to represent a Baker cyst. IMPRESSION: No evidence of DVT within the right lower extremity. Electronically Signed   By: Sandi Mariscal M.D.   On: 11/16/2017 17:14   ASSESSMENT AND PLAN:  Arpi Diebold  is a 84 y.o. female with a known history of paroxysmal atrial fibrillation, arthritis, hypertension, past tobacco use was recently in the hospital and had right total knee arthroplasty on 11/13/2017.  Patient was discharged to skilled nursing facility yesterday.  She was found febrile and hypoxia in facility.  *Sepsis Etiology is unclear.  CT scan of the chest shows no infiltrates.  Could be atelectasis. No UTI. Right lower extremity venous ultrasound: No DVT.  Continue incentive spirometer.  Blood culture sent and pending. Continue vancomycin and cefepime until cultures are negative.  Consult orthopedics due to recent right total knee arthroplasty which could also be the source.  *Acute hypoxic respiratory failure.  Mild pulmonary edema on CT scan of the chest. Given 1 dose of IV Lasix.  Could be due to fluids during an after surgery.  *Hypertension.  Continue home medications  *  Paroxysmal atrial fibrillation.  On Pradaxa and  atenolol.  All the records are reviewed and case discussed with Care Management/Social Worker. Management plans discussed with the patient, family and they are in agreement.  CODE STATUS: Full Code  TOTAL TIME TAKING CARE OF THIS PATIENT: 35 minutes.   More than 50% of the time was spent in counseling/coordination of care: YES  POSSIBLE D/C IN 2 DAYS, DEPENDING ON CLINICAL CONDITION.   Demetrios Loll M.D on 11/17/2017 at 2:18 PM  Between 7am to 6pm - Pager - [phone]  After 6pm go to [email or web address] - [facility]

## 2017-11-18 ENCOUNTER — Inpatient Hospital Stay
Admission: RE | Admit: 2017-11-18 | Discharge: 2017-11-18 | Disposition: A | Discharge status: Home / Self Care | Payer: Medicare Other | Source: Ambulatory Visit | Attending: Internal Medicine | Admitting: Internal Medicine

## 2017-11-18 LAB — BASIC METABOLIC PANEL
ANION GAP: 8 (ref 5–15)
BUN: 14 mg/dL (ref 8–23)
CHLORIDE: 100 mmol/L (ref 98–111)
CO2: 32 mmol/L (ref 22–32)
CREATININE: 0.96 mg/dL (ref 0.44–1.00)
Calcium: 8.7 mg/dL — ABNORMAL LOW (ref 8.9–10.3)
GFR, EST NON AFRICAN AMERICAN: 52 mL/min — AB (ref >60)
GLUCOSE: 111 mg/dL — AB (ref 70–99)
POTASSIUM: 3.4 mmol/L — AB (ref 3.5–5.1)
Sodium: 140 mmol/L (ref 135–145)

## 2017-11-18 LAB — ECHOCARDIOGRAM COMPLETE
Height: 61 in
Weight: 2531.2 oz

## 2017-11-18 LAB — URINE CULTURE: Culture: NO GROWTH

## 2017-11-18 LAB — MAGNESIUM: MAGNESIUM: 2.6 mg/dL — AB (ref 1.7–2.4)

## 2017-11-18 MED ORDER — ENSURE ENLIVE PO LIQD: 237.0000 mL | Freq: Two times a day (BID) | ORAL | Status: DC

## 2017-11-18 NOTE — Consult Note (Signed)
ORTHOPAEDIC CONSULTATION  REQUESTING PHYSICIAN: Demetrios Loll, MD  Chief Complaint:   Fever s/p R TKA on 11/12/17  History of Present Illness: Colleen [name] is a 84 y.o. female who underwent a R TKA by Dr. Roland Rack on 11/12/17. She was discharged to [facility] on 11/15/17, but found to have a fever in the 101 degree F range and decreased O2 sats in the 80s so she was not accepted. She was readmitted and workup was initiated. She was tachycardic prior to today. Blood cultures were drawn and have remained negative to date, UA was negative, DVT scan of RLE was negative, CXR and CT PE were also negative for infiltrates and PE. She was started on empiric IV Vancomycin and cefepime on admission. Since admission, she has been afebrile. She has maintained normal O2 sats on 2L O2 today. Last night, she went into RVR. She does not feel ill and feels like she is making incremental progress post-operatively in regards to her knee as well as general physical condition.   I was notified of her admission this AM and consulted after remainder of workup appeared negative.  Past Medical History:  Diagnosis Date  . Aortic valve disorder   . Arthritis   . Asthma   . Atrial fibrillation ([place])   . Breast cancer ([place]) 2004   LT MASTECTOMY  . Fibrocystic disease of both breasts   . GERD (gastroesophageal reflux disease)   . Hx of colonic polyps   . Hyperlipidemia   . Hypertension   . Malignant neoplasm of breast ([place])    left  . Osteoarthritis of right knee 2019  . Stroke [facility]) 2008   right peripheral vision is gone   Past Surgical History:  Procedure Laterality Date  . ABDOMINAL HYSTERECTOMY  1968  . APPENDECTOMY    . BREAST EXCISIONAL BIOPSY Right 1986   NEG  . CATARACT EXTRACTION W/ INTRAOCULAR LENS  IMPLANT, BILATERAL    . COLONOSCOPY  2000, 2003, 2008, 2013  . deviated septum repair  1973  . ESOPHAGOGASTRODUODENOSCOPY  2013  . EYE SURGERY     . MASTECTOMY Left 2004   BREAST CA  . TONSILLECTOMY    . TOTAL KNEE ARTHROPLASTY Right 11/13/2017   Procedure: TOTAL KNEE ARTHROPLASTY;  Surgeon: Corky Mull, MD;  Location: [facility] ORS;  Service: Orthopedics;  Laterality: Right;   Social History   Socioeconomic History  . Marital status: Widowed    Spouse name: Not on file  . Number of children: Not on file  . Years of education: Not on file  . Highest education level: Not on file  Occupational History  . Not on file  Social Needs  . Financial resource strain: Not on file  . Food insecurity:    Worry: Not on file    Inability: Not on file  . Transportation needs:    Medical: Not on file    Non-medical: Not on file  Tobacco Use  . Smoking status: Former Smoker    Types: Cigarettes    Last attempt to quit: 1984    Years since quitting: 35.5  . Smokeless tobacco: Never Used  Substance and Sexual Activity  . Alcohol use: Yes    Alcohol/week: 0.0 oz    Comment: occassionally  . Drug use: Never  . Sexual activity: Not on file  Lifestyle  . Physical activity:    Days per week: Not on file    Minutes per session: Not on file  . Stress: Not on file  Relationships  . Social connections:    Talks on phone: Not on file    Gets together: Not on file    Attends religious service: Not on file    Active member of club or organization: Not on file    Attends meetings of clubs or organizations: Not on file    Relationship status: Not on file  Other Topics Concern  . Not on file  Social History Narrative  . Not on file   Family History  Problem Relation Age of Onset  . Breast cancer Cousin   . Breast cancer Other   . Diabetes Mother   . Heart disease Mother   . Heart disease Father   . Heart disease Sister   . Heart disease Brother    Allergies  Allergen Reactions  . Ibuprofen Swelling  . Codeine Anxiety    Hallucination.   Prior to Admission medications   Medication Sig Start Date End Date Taking? Authorizing  Provider  atenolol (TENORMIN) 50 MG tablet Take 50 mg by mouth 2 (two) times daily.  08/30/14  Yes [provider]  b complex vitamins tablet Take 1 tablet by mouth daily.   Yes [provider]  Biotin 1000 MCG tablet Take 1,000 mcg by mouth daily.   Yes [provider]  Calcium Carb-Cholecalciferol (CALCIUM 600+D3 PO) Take 1 tablet by mouth daily.   Yes [provider]  cetirizine (ZYRTEC) 10 MG tablet Take 10 mg by mouth daily.   Yes [provider]  cholecalciferol (VITAMIN D-400) 400 units TABS tablet Take 400 Units by mouth daily.   Yes [provider]  fluticasone (FLONASE) 50 MCG/ACT nasal spray Place 2 sprays into both nostrils daily.    Yes [provider]  folic acid (FOLVITE) 355 MCG tablet Take 400 mcg by mouth daily.   Yes [provider]  furosemide (LASIX) 20 MG tablet Take 20 mg by mouth daily. 08/15/14  Yes [provider]  lisinopril (PRINIVIL,ZESTRIL) 40 MG tablet Take 40 mg by mouth 2 (two) times daily.   Yes [provider]  Misc Natural Products (OSTEO BI-FLEX ADV TRIPLE ST PO) Take 1 tablet by mouth 2 (two) times daily.   Yes [provider]  montelukast (SINGULAIR) 10 MG tablet Take 10 mg by mouth at bedtime.   Yes [provider]  Multiple Vitamin (MULTIVITAMIN WITH MINERALS) TABS tablet Take 1 tablet by mouth daily.   Yes [provider]  NIFEdipine (PROCARDIA-XL/ADALAT-CC/NIFEDICAL-XL) 30 MG 24 hr tablet Take 30 mg by mouth daily.   Yes [provider]  Omega-3 Fatty Acids (FISH OIL PO) Take 1 capsule by mouth daily.   Yes [provider]  PRADAXA 150 MG CAPS capsule Take 150 mg by mouth 2 (two) times daily. 10/06/14  Yes [provider]  rosuvastatin (CRESTOR) 10 MG tablet Take 10 mg by mouth at bedtime.   Yes [provider]  spironolactone (ALDACTONE) 25 MG tablet Take 25 mg by mouth daily.   Yes [provider]   vitamin C (ASCORBIC ACID) 500 MG tablet Take 1,000 mg by mouth daily.    Yes [provider]  vitamin E 400 UNIT capsule Take 400 Units by mouth daily.   Yes [provider]  oxyCODONE (OXY IR/ROXICODONE) 5 MG immediate release tablet Take 1-2 tablets (5-10 mg total) by mouth every 4 (four) hours as needed for moderate pain (pain score 4-6). 11/16/17   Lattie Corns, PA-C  traMADol Veatrice Bourbon) 50  MG tablet Take 1-2 tablets (50-100 mg total) by mouth every 6 (six) hours as needed for moderate pain. 11/16/17   Lattie Corns, PA-C   Recent Labs    11/16/17 [phone] 11/16/17 1503 11/17/17 0710 11/18/17 0459  WBC 10.8 11.3* 9.2  --   HGB 11.3* 11.7* 10.8*  --   HCT 33.3* 34.3* 31.2*  --   PLT 146* 156 163  --   K 4.1 3.9 3.4* 3.4*  CL 100 98 99 100  CO2 28 27 30  32  BUN 13 13 11 14   CREATININE 0.86 0.80 0.89 0.96  GLUCOSE 114* 103* 115* 111*  CALCIUM 8.9 9.1 8.5* 8.7*  INR  --  1.67  --   --    US Venous Img Lower Unilateral Right  Result Date: 11/16/2017 CLINICAL DATA:  Right lower extremity pain and edema. History of right knee replacement approximately 3 days ago. History of breast cancer. Former smoker. Evaluate for DVT. EXAM: RIGHT LOWER EXTREMITY VENOUS DOPPLER ULTRASOUND TECHNIQUE: Gray-scale sonography with graded compression, as well as color Doppler and duplex ultrasound were performed to evaluate the lower extremity deep venous systems from the level of the common femoral vein and including the common femoral, femoral, profunda femoral, popliteal and calf veins including the posterior tibial, peroneal and gastrocnemius veins when visible. The superficial great saphenous vein was also interrogated. Spectral Doppler was utilized to evaluate flow at rest and with distal augmentation maneuvers in the common femoral, femoral and popliteal veins. COMPARISON:  None. FINDINGS: Contralateral Common Femoral Vein: Respiratory phasicity is normal and symmetric with the  symptomatic side. No evidence of thrombus. Normal compressibility. Common Femoral Vein: No evidence of thrombus. Normal compressibility, respiratory phasicity and response to augmentation. Saphenofemoral Junction: No evidence of thrombus. Normal compressibility and flow on color Doppler imaging. Profunda Femoral Vein: No evidence of thrombus. Normal compressibility and flow on color Doppler imaging. Femoral Vein: No evidence of thrombus. Normal compressibility, respiratory phasicity and response to augmentation. Popliteal Vein: No evidence of thrombus. Normal compressibility, respiratory phasicity and response to augmentation. Calf Veins: No evidence of thrombus. Normal compressibility and flow on color Doppler imaging. Superficial Great Saphenous Vein: No evidence of thrombus. Normal compressibility. Venous Reflux:  None. Other Findings: Note is made of an approximately 1.9 x 1.3 x 1.4 cm anechoic fluid collection with the right popliteal fossa favored to represent a Baker cyst. IMPRESSION: No evidence of DVT within the right lower extremity. Electronically Signed   By: Sandi Mariscal M.D.   On: 11/16/2017 17:14   11/16/17: CT PE Protocol: IMPRESSION: 1. No pulmonary emboli. 2. Mild cardiomegaly with a small right pleural effusion and slight dependent pulmonary edema in the right lung. This could represent mild congestive heart failure. 3.  Aortic Atherosclerosis (ICD10-I70.0). 4. Slight prominence of the mucosa of esophagus diffusely. This could represent esophagitis.    Positive ROS: All other systems have been reviewed and were otherwise negative with the exception of those mentioned in the HPI and as above.  Physical Exam: BP (!) 127/56 (BP Location: Right Arm)   Pulse 98   Temp 97.8 F (36.6 C) (Oral)   Resp 16   Ht 5\' 1"  (1.549 m)   Wt 71.8 kg (158 lb 3.2 oz)   SpO2 99%   BMI 29.89 kg/m  General:  Alert, no acute distress Psychiatric:  Patient is competent for consent with normal mood  and affect   Cardiovascular:  No pedal edema, peripheral pulses present Respiratory:  No wheezing,  non-labored breathing GI:  Abdomen is soft and non-tender Neurologic:  Sensation intact distally, CN grossly intact  Orthopedic Exam:  RLE: 5/5 DF/PF/EHL SILT grossly over foot Foot wwp Incision is c/d/i. There is mild erythema about superior portion of incision. There is no expressible drainage. There is a 1+ effusion of the knee.  RoM: passively 5-70   Assessment/Plan: CELITA ARON is a 84 y.o. female with fever, tachycardia, and decreased O2 sats without known etiology   1. I discussed with the patient that her knee looks appropriate in regards to normal post-surgical changes. There is a low suspicion for postoperative knee joint infection given the appearance of the wound, appropriate amount of swelling, and patient's clinical improvement. I discussed the possibility of a knee joint aspiration, but this carries risk of introducing an infection as well so this was deferred given the low clinical suspicion.  2. Dressing changed today. [employer] as tolerated.   3. Continue PT/OT as able  4. Agree with Cardiology evaluation given a-fib with RVR last night.   5. Please page with any questions.   Leim Fabry   11/18/2017 3:26 PM

## 2017-11-18 NOTE — Progress Notes (Signed)
*  PRELIMINARY RESULTS* Echocardiogram 2D Echocardiogram has been performed.  Colleen [name] 11/18/2017, 10:02 AM

## 2017-11-18 NOTE — Progress Notes (Signed)
[place] [facility] [place] NAME: Colleen [name]    MR#:  [id number]  DATE OF BIRTH:  [birth year]  SUBJECTIVE:  CHIEF COMPLAINT:   Chief Complaint  Patient presents with  . Fever  . Shortness of Breath   The patient had right knee pain and generalized weakness.  REVIEW OF SYSTEMS:  Review of Systems  Constitutional: Positive for malaise/fatigue. Negative for chills and fever.  HENT: Negative for sore throat.   Eyes: Negative for blurred vision and double vision.  Respiratory: Negative for cough, hemoptysis, shortness of breath, wheezing and stridor.   Cardiovascular: Negative for chest pain, palpitations, orthopnea and leg swelling.  Gastrointestinal: Negative for abdominal pain, blood in stool, diarrhea, melena, nausea and vomiting.  Genitourinary: Negative for dysuria, flank pain and hematuria.  Musculoskeletal: Positive for joint pain. Negative for back pain.  Neurological: Negative for dizziness, sensory change, focal weakness, seizures, loss of consciousness, weakness and headaches.  Endo/Heme/Allergies: Negative for polydipsia.  Psychiatric/Behavioral: Negative for depression. The patient is not nervous/anxious.     DRUG ALLERGIES:   Allergies  Allergen Reactions  . Ibuprofen Swelling  . Codeine Anxiety    Hallucination.   VITALS:  Blood pressure (!) 127/56, pulse 98, temperature 97.8 F (36.6 C), temperature source Oral, resp. rate 16, height 5\' 1"  (1.549 m), weight 158 lb 3.2 oz (71.8 kg), SpO2 99 %. PHYSICAL EXAMINATION:  Physical Exam  Constitutional: She is oriented to person, place, and time. She appears well-developed.  HENT:  Head: Normocephalic.  Mouth/Throat: Oropharynx is clear and moist.  Eyes: Pupils are equal, round, and reactive to light. Conjunctivae and EOM are normal. No scleral icterus.  Neck: Normal range of motion. Neck supple. No JVD present. No tracheal deviation present.  Cardiovascular: Normal rate,  regular rhythm and normal heart sounds. Exam reveals no gallop.  No murmur heard. Pulmonary/Chest: Effort normal and breath sounds normal. No respiratory distress. She has no wheezes. She has no rales.  Abdominal: Soft. Bowel sounds are normal. She exhibits no distension. There is no tenderness. There is no rebound.  Musculoskeletal: She exhibits no edema or tenderness.  Right knee in dressing.  Neurological: She is alert and oriented to person, place, and time. No cranial nerve deficit.  Skin: No rash noted. No erythema.  Psychiatric: She has a normal mood and affect.   LABORATORY PANEL:  Female CBC Recent Labs  Lab 11/17/17 0710  WBC 9.2  HGB 10.8*  HCT 31.2*  PLT 163   ------------------------------------------------------------------------------------------------------------------ Chemistries  Recent Labs  Lab 11/16/17 1503  11/18/17 0459  NA 134*   < > 140  K 3.9   < > 3.4*  CL 98   < > 100  CO2 27   < > 32  GLUCOSE 103*   < > 111*  BUN 13   < > 14  CREATININE 0.80   < > 0.96  CALCIUM 9.1   < > 8.7*  MG  --   --  2.6*  AST 30  --   --   ALT 10  --   --   ALKPHOS 20*  --   --   BILITOT 1.6*  --   --    < > = values in this interval not displayed.   RADIOLOGY:  No results found. ASSESSMENT AND PLAN:  Colleen [name]  is a 84 y.o. female with a known history of paroxysmal atrial fibrillation, arthritis, hypertension, past tobacco use was recently in the  hospital and had right total knee arthroplasty on 11/13/2017.  Patient was discharged to skilled nursing facility yesterday.  She was found febrile and hypoxia in facility.  *Sepsis Etiology is unclear.  CT scan of the chest shows no infiltrates.  Could be atelectasis. No UTI. Right lower extremity venous ultrasound: No DVT.  Continue incentive spirometer.  Blood culture negative so far. Continue vancomycin and cefepime until cultures are negative.   Waiting for orthopedic surgery consult due to recent right total knee  arthroplasty which could also be the source.  *Acute hypoxic respiratory failure.  Mild pulmonary edema on CT scan of the chest. Given 1 dose of IV Lasix.  Could be due to fluids during an after surgery.  *Hypertension.  Continue home medications  *Paroxysmal atrial fibrillation.  On Pradaxa and atenolol.  RVR last night.  Cardiology consult.  Hypokalemia.  Given potassium supplement. All the records are reviewed and case discussed with Care Management/Social Worker. Management plans discussed with the patient, family and they are in agreement.  CODE STATUS: Full Code  TOTAL TIME TAKING CARE OF THIS PATIENT: 27 minutes.   More than 50% of the time was spent in counseling/coordination of care: YES  POSSIBLE D/C IN 1-2 DAYS, DEPENDING ON CLINICAL CONDITION.   Demetrios Loll M.D on 11/18/2017 at 1:59 PM  Between 7am to 6pm - Pager - [phone]  After 6pm go to [email or web address] - [facility]

## 2017-11-18 NOTE — NC FL2 (Signed)
[place] LEVEL OF CARE SCREENING TOOL     IDENTIFICATION  Patient Name: Colleen [name] Birthdate: 07/02/1930 Sex: female Admission Date (Current Location): 11/16/2017  [place] and [place] Number:  Engineering geologist and Address:  [facility], [address], [place], [place] [address]      Provider Number: [id number]  Attending Physician Name and Address:  Demetrios Loll, MD  Relative Name and Phone Number:       Current Level of Care: Hospital Recommended Level of Care: [place] Prior Approval Number:    Date Approved/Denied:   PASRR Number: [id number]  Discharge Plan: SNF    Current Diagnoses: Patient Active Problem List   Diagnosis Date Noted  . Sepsis ([place]) 11/16/2017  . Status post total knee replacement using cement, right 11/13/2017    Orientation RESPIRATION BLADDER Height & Weight     Self, Time, Situation, Place  O2(2L o2) Continent Weight: 158 lb 3.2 oz (71.8 kg) Height:  5\' 1"  (154.9 cm)  BEHAVIORAL SYMPTOMS/MOOD NEUROLOGICAL BOWEL NUTRITION STATUS      Continent    AMBULATORY STATUS COMMUNICATION OF NEEDS Skin   Extensive Assist Verbally Surgical wounds                       Personal Care Assistance Level of Assistance  Bathing, Feeding, Dressing Bathing Assistance: Limited assistance Feeding assistance: Independent Dressing Assistance: Limited assistance     Functional Limitations Info    Sight Info: Adequate Hearing Info: Adequate Speech Info: Adequate    SPECIAL CARE FACTORS FREQUENCY  PT (By licensed PT), OT (By licensed OT)     PT Frequency: Up to 5X per week OT Frequency: Up to 3X per week            Contractures Contractures Info: Not present    Additional Factors Info  Code Status, Allergies Code Status Info: Full Allergies Info: Ibuprofen, Codeine           Current Medications (11/18/2017):  This is the current hospital active medication list Current  Facility-Administered Medications  Medication Dose Route Frequency Provider Last Rate Last Dose  . acetaminophen (TYLENOL) tablet 650 mg  650 mg Oral Q6H PRN Hillary Bow, MD       Or  . acetaminophen (TYLENOL) suppository 650 mg  650 mg Rectal Q6H PRN Sudini, Srikar, MD      . albuterol (PROVENTIL) (2.5 MG/3ML) 0.083% nebulizer solution 2.5 mg  2.5 mg Nebulization Q2H PRN Sudini, Srikar, MD      . atenolol (TENORMIN) tablet 50 mg  50 mg Oral BID Hillary Bow, MD   50 mg at 11/18/17 6503  . bisacodyl (DULCOLAX) suppository 10 mg  10 mg Rectal Daily PRN Sudini, Alveta Heimlich, MD      . ceFEPIme (MAXIPIME) 2 g in sodium chloride 0.9 % 100 mL IVPB  2 g Intravenous Q12H Hillary Bow, MD   Stopped at 11/18/17 0840  . dabigatran (PRADAXA) capsule 150 mg  150 mg Oral BID Hillary Bow, MD   150 mg at 11/18/17 0811  . docusate sodium (COLACE) capsule 100 mg  100 mg Oral BID Hillary Bow, MD   100 mg at 11/18/17 0810  . fluticasone (FLONASE) 50 MCG/ACT nasal spray 2 spray  2 spray Each Nare Daily Hillary Bow, MD   2 spray at 11/18/17 0810  . folic acid (FOLVITE) tablet 0.5 mg  500 mcg Oral Daily Sudini, Alveta Heimlich, MD   0.5 mg at  11/18/17 9449  . furosemide (LASIX) tablet 20 mg  20 mg Oral Daily Hillary Bow, MD   20 mg at 11/18/17 0812  . lisinopril (PRINIVIL,ZESTRIL) tablet 40 mg  40 mg Oral BID Hillary Bow, MD   40 mg at 11/18/17 0810  . loratadine (CLARITIN) tablet 10 mg  10 mg Oral Daily Hillary Bow, MD   10 mg at 11/18/17 0811  . MEDLINE mouth rinse  15 mL Mouth Rinse BID Hillary Bow, MD   15 mL at 11/16/17 2233  . montelukast (SINGULAIR) tablet 10 mg  10 mg Oral QHS Hillary Bow, MD   10 mg at 11/17/17 2104  . NIFEdipine (PROCARDIA-XL/ADALAT-CC/NIFEDICAL-XL) 24 hr tablet 30 mg  30 mg Oral Daily Hillary Bow, MD   30 mg at 11/18/17 0811  . ondansetron (ZOFRAN) tablet 4 mg  4 mg Oral Q6H PRN Hillary Bow, MD       Or  . ondansetron (ZOFRAN) injection 4 mg  4 mg Intravenous Q6H PRN  Sudini, Alveta Heimlich, MD      . oxyCODONE (Oxy IR/ROXICODONE) immediate release tablet 5-10 mg  5-10 mg Oral Q4H PRN Hillary Bow, MD   5 mg at 11/17/17 2010  . polyethylene glycol (MIRALAX / GLYCOLAX) packet 17 g  17 g Oral Daily PRN Sudini, Alveta Heimlich, MD      . rosuvastatin (CRESTOR) tablet 10 mg  10 mg Oral QHS Hillary Bow, MD   10 mg at 11/17/17 2103  . spironolactone (ALDACTONE) tablet 25 mg  25 mg Oral Daily Hillary Bow, MD   25 mg at 11/18/17 0811  . vancomycin (VANCOCIN) IVPB 1000 mg/200 mL premix  1,000 mg Intravenous Q18H Hillary Bow, MD   Stopped at 11/18/17 [phone]     Discharge Medications: Please see discharge summary for a list of discharge medications.  Relevant Imaging Results:  Relevant Lab Results:   Additional Information SS# [id number]  Zettie Pho, LCSW

## 2017-11-18 NOTE — Progress Notes (Signed)
Pt is alert and oriented. Medicated for pain x1 with good results. Up to bathroom with assistance. Still remaining on 2L of oxygen.

## 2017-11-18 NOTE — Clinical Social Work Note (Signed)
Clinical Social Work Assessment  Patient Details  Name: Colleen [name] MRN: [id number] Date of Birth: 06/11/1930  Date of referral:  11/18/17               Reason for consult:  Facility Placement                Permission sought to share information with:  Facility Contact Representative Permission granted to share information::  Yes, Verbal Permission Granted  Name::        Agency::  [facility]  Relationship::     Contact Information:     Housing/Transportation Living arrangements for the past 2 months:  Single Family Home Source of Information:  Patient, Medical Team, Facility Patient Interpreter Needed:  None Criminal Activity/Legal Involvement Pertinent to Current Situation/Hospitalization:  No - Comment as needed Significant Relationships:  Community Support, Neighbor Lives with:  Self Do you feel safe going back to the place where you live?  Yes Need for family participation in patient care:  No (Coment)  Care giving concerns:  Admitted from SNF   Social Worker assessment / plan:  The patient discharge to [facility] and readmitted to the hospital within 24 hours with code Sepsis. The CSW met with the patient at bedside to discuss discharge planning. The patient confirmed that she would like to return to [facility] when stable.  The patient will most likely discharge on Monday. The CSW has alerted Taylor, admissions coordinator for [facility]. CSW is following.    Employment status:  Retired Insurance information:  Medicare PT Recommendations:  Not assessed at this time Information / Referral to community resources:     Patient/Family's Response to care: The patient thanked the CSW.  Patient/Family's Understanding of and Emotional Response to Diagnosis, Current Treatment, and Prognosis:  The patient is in agreement with the discharge plan.  Emotional Assessment Appearance:  Appears younger than stated age Attitude/Demeanor/Rapport:  Engaged, Gracious Affect  (typically observed):  Appropriate, Pleasant Orientation:  Oriented to Self, Oriented to Place, Oriented to Situation, Oriented to  Time Alcohol / Substance use:  Never Used Psych involvement (Current and /or in the community):  No (Comment)  Discharge Needs  Concerns to be addressed:  Care Coordination, Discharge Planning Concerns Readmission within the last 30 days:  Yes Current discharge risk:  Physical Impairment Barriers to Discharge:  Continued Medical Work up    M , LCSW 11/18/2017, 10:06 AM  

## 2017-11-18 NOTE — Progress Notes (Signed)
Physical Therapy Treatment Patient Details Name: Colleen [name] MRN: [id number] DOB: 1931-04-01 Today's Date: 11/18/2017    History of Present Illness Pt re-admitted to hospital after brief transport to [facility] after R TKR (11/13/17) without reported post-op complications. Pt presented to ED with fever and elevated HR. Nursing reported RLE doppler negative, chest imaging negative but HR has been elevated and pt on 2LO2. Nursing cleared PT to work with pt prior to eval. PMH includes asthma, CVA, aortic valve disorder, breast cancer, GERD, hyperlipidemia and hypertension.    PT Comments    Patient performs bed mobility with min assist  And transfers with min asssit wih RW. She ambulates 20 feet with Rw and min assist. She performs seated LAQ and seated right knee flex at edge of bed x 10. She needs mod assist for sit to supine bed mobility Sje will continue to benefit from skilled PT to improve mobility , standing tolerance and gait. .   Follow Up Recommendations  SNF     Equipment Recommendations  Rolling walker with 5" wheels;3in1 (PT)    Recommendations for Other Services       Precautions / Restrictions Precautions Precautions: Fall Restrictions Weight Bearing Restrictions: Yes RLE Weight Bearing: Weight bearing as tolerated    Mobility  Bed Mobility Overal bed mobility: Needs Assistance Bed Mobility: Supine to Sit;Sit to Supine     Supine to sit: Min assist Sit to supine: Min assist   General bed mobility comments: Min A to assist RLE on/off bed. Cues for hand placement and to improve anterior weight shifting to scoot towards EOB. HR in the 100's to 110's at rest, incr. to 120's EOB.  Transfers Overall transfer level: Needs assistance Equipment used: Rolling walker (2 wheeled) Transfers: Sit to/from Stand Sit to Stand: Supervision            Ambulation/Gait Ambulation/Gait assistance: Min guard Gait Distance (Feet): 20 Feet Assistive device: Rolling walker (2  wheeled) Gait Pattern/deviations: Step-to pattern Gait velocity: pt demonstrates slow shuffling gait with amb requiring CGA and RW. Pt states that bearing weight drastically increases pain and declines further gait training during this session. Gait velocity interpretation: <1.31 ft/sec, indicative of household Metallurgist Rankin (Stroke Patients Only)       Balance Overall balance assessment: Needs assistance   Sitting balance-Leahy Scale: Good     Standing balance support: No upper extremity supported Standing balance-Leahy Scale: Fair Standing balance comment: Able to maintain balance in standing with minimal upper extremity support.                            Cognition Arousal/Alertness: Awake/alert Behavior During Therapy: WFL for tasks assessed/performed Overall Cognitive Status: Within Functional Limits for tasks assessed                                        Exercises Total Joint Exercises Ankle Circles/Pumps: Both;20 reps;Seated;AROM Quad Sets: AROM;Right;10 reps;Supine Gluteal Sets: Both;15 reps Short Arc Quad: Right;10 reps;AROM;Supine Heel Slides: Strengthening;Right;AAROM;10 reps;Supine Hip ABduction/ADduction: Strengthening;Right;10 reps;Supine Straight Leg Raises: Strengthening;Right;5 reps;Supine Long Arc Quad: Strengthening;AAROM;5 reps;Seated Knee Flexion: Right;15 reps    General Comments        Pertinent Vitals/Pain Pain Assessment: No/denies pain Pain Location: R  knee Pain Descriptors / Indicators: Aching;Operative site guarding    Home Living                      Prior Function            PT Goals (current goals can now be found in the care plan section) Acute Rehab PT Goals Patient Stated Goal: I need to get to rehab PT Goal Formulation: With patient Time For Goal Achievement: 12/01/17 Potential to Achieve Goals: Good Progress  towards PT goals: Progressing toward goals    Frequency    7X/week      PT Plan Current plan remains appropriate    Co-evaluation              AM-PAC PT "6 Clicks" Daily Activity  Outcome Measure  Difficulty turning over in bed (including adjusting bedclothes, sheets and blankets)?: None Difficulty moving from lying on back to sitting on the side of the bed? : A Little Difficulty sitting down on and standing up from a chair with arms (e.g., wheelchair, bedside commode, etc,.)?: A Little Help needed moving to and from a bed to chair (including a wheelchair)?: A Lot     6 Click Score: 12    End of Session Equipment Utilized During Treatment: Oxygen Activity Tolerance: Patient limited by pain Patient left: in bed;with call bell/phone within reach;with bed alarm set Nurse Communication: Mobility status;Other (comment) PT Visit Diagnosis: Unsteadiness on feet (R26.81);Muscle weakness (generalized) (M62.81);Pain;Other abnormalities of gait and mobility (R26.89) Pain - Right/Left: Right Pain - part of body: Knee     Time: 1200-1245 PT Time Calculation (min) (ACUTE ONLY): 45 min  Charges:  $Gait Training: 23-37 mins $Therapeutic Exercise: 8-22 mins                    G Codes:  Functional Assessment Tool Used: Clinical judgement Functional Limitation: Mobility: Walking and moving around Mobility: Walking and Moving Around Current Status (K9326): At least 40 percent but less than 60 percent impaired, limited or restricted       Alanson Puls, PT DPT 11/18/2017, 2:13 PM

## 2017-11-18 NOTE — Progress Notes (Signed)
Pt with a b/p of 99/50 pt has Atenolol and lisinopril ordered tonight. Spoke with Dr. Jannifer Franklin he said to hole blood pressure medication. Continue to monitor heart rate.

## 2017-11-19 LAB — CBC WITH DIFFERENTIAL/PLATELET
BASOS ABS: 0 10*3/uL (ref 0–0.1)
Basophils Relative: 0 %
Eosinophils Absolute: 0.4 10*3/uL (ref 0–0.7)
Eosinophils Relative: 5 %
HEMATOCRIT: 29.6 % — AB (ref 35.0–47.0)
Hemoglobin: 10.5 g/dL — ABNORMAL LOW (ref 12.0–16.0)
LYMPHS PCT: 11 %
Lymphs Abs: 0.9 10*3/uL — ABNORMAL LOW (ref 1.0–3.6)
MCH: 32.7 pg (ref 26.0–34.0)
MCHC: 35.3 g/dL (ref 32.0–36.0)
MCV: 92.6 fL (ref 80.0–100.0)
MONO ABS: 0.9 10*3/uL (ref 0.2–0.9)
Monocytes Relative: 12 %
NEUTROS ABS: 5.6 10*3/uL (ref 1.4–6.5)
Neutrophils Relative %: 72 %
Platelets: 220 10*3/uL (ref 150–440)
RBC: 3.2 MIL/uL — ABNORMAL LOW (ref 3.80–5.20)
RDW: 12.8 % (ref 11.5–14.5)
WBC: 7.9 10*3/uL (ref 3.6–11.0)

## 2017-11-19 LAB — SYNOVIAL CELL COUNT + DIFF, W/ CRYSTALS
Crystals, Fluid: NONE SEEN
Eosinophils-Synovial: 0 %
LYMPHOCYTES-SYNOVIAL FLD: 1 %
Monocyte-Macrophage-Synovial Fluid: 6 %
NEUTROPHIL, SYNOVIAL: 93 %
Other Cells-SYN: 0
WBC, Synovial: 27887 /mm3 — ABNORMAL HIGH (ref 0–200)

## 2017-11-19 LAB — VANCOMYCIN, TROUGH: VANCOMYCIN TR: 19 ug/mL (ref 15–20)

## 2017-11-19 MED ORDER — LEVOFLOXACIN 500 MG PO TABS: 500.0000 mg | ORAL_TABLET | Freq: Once | ORAL | Status: DC

## 2017-11-19 MED ORDER — LEVOFLOXACIN 250 MG PO TABS: 250.0000 mg | ORAL_TABLET | Freq: Every day | ORAL | 0 refills | Status: AC

## 2017-11-19 MED ORDER — LEVOFLOXACIN 500 MG PO TABS: 250.0000 mg | ORAL_TABLET | Freq: Every day | ORAL | Status: DC

## 2017-11-19 NOTE — Progress Notes (Signed)
Subjective:   Right total Knee arthroplasty.  POD 6. Patient reports pain as 6 on 0-10 scale.   Patient is well but was re-admitted following discharge due to fever and resp distress. Pt reports improved breathing, does still report weakness in the right knee. Plan is to go Skilled nursing facility after hospital stay. Negative for chest pain and shortness of breath Fever: no Gastrointestinal:Negative for nausea and vomiting  Objective: Vital signs in last 24 hours: Temp:  [98.2 F (36.8 C)-98.3 F (36.8 C)] 98.2 F (36.8 C) (07/14 2253) Pulse Rate:  [90-102] 90 (07/14 2253) BP: (99-112)/(50-56) 112/56 (07/14 2253) SpO2:  [99 %-100 %] 99 % (07/14 2253) Weight:  [73.7 kg (162 lb 6.4 oz)] 73.7 kg (162 lb 6.4 oz) (07/15 0445)  Intake/Output from previous day:  Intake/Output Summary (Last 24 hours) at 11/19/2017 0747 Last data filed at 11/19/2017 0434 Gross per 24 hour  Intake 420 ml  Output 1425 ml  Net -1005 ml    Intake/Output this shift: No intake/output data recorded.  Labs: Recent Labs    11/16/17 1503 11/17/17 0710 11/19/17 0354  HGB 11.7* 10.8* 10.5*   Recent Labs    11/17/17 0710 11/19/17 0354  WBC 9.2 7.9  RBC 3.34* 3.20*  HCT 31.2* 29.6*  PLT 163 220   Recent Labs    11/17/17 0710 11/18/17 0459  NA 139 140  K 3.4* 3.4*  CL 99 100  CO2 30 32  BUN 11 14  CREATININE 0.89 0.96  GLUCOSE 115* 111*  CALCIUM 8.5* 8.7*   Recent Labs    11/16/17 1503  INR 1.67     EXAM General - Patient is Alert, Appropriate and Oriented Extremity - ABD soft Sensation intact distally Dorsiflexion/Plantar flexion intact Incision: scant drainage  Minimal erythema along the more proximal aspect of the right knee incision. Pt with difficulty performing a straight leg raise without assistance. Minimal effusion present to the right knee. Dressing/Incision - sero-sanguinous drainage Motor Function - intact, moving foot and toes well on exam.  Abdomen soft with  normal BS.  Past Medical History:  Diagnosis Date  . Aortic valve disorder   . Arthritis   . Asthma   . Atrial fibrillation ([place])   . Breast cancer ([place]) 2004   LT MASTECTOMY  . Fibrocystic disease of both breasts   . GERD (gastroesophageal reflux disease)   . Hx of colonic polyps   . Hyperlipidemia   . Hypertension   . Malignant neoplasm of breast ([place])    left  . Osteoarthritis of right knee 2019  . Stroke [facility]) 2008   right peripheral vision is gone    Assessment/Plan:    Active Problems:   Sepsis ([place])  Estimated body mass index is 30.69 kg/m as calculated from the following:   Height as of this encounter: 5\' 1"  (1.549 m).   Weight as of this encounter: 73.7 kg (162 lb 6.4 oz). Advance diet Up with therapy   Labs reviewed this AM, WBC 7.9 this AM.  Blood cultures have been negative for 3 days. No growth to urine cultures at this time as well. CT chest negative, Echo performed yesterday. Due to mild erythema to the right knee and no obvious source of infection a right knee aspiration was performed at the bedside today. This synovial fluid will be sent for cell count with diff, culture, gram stain and crystal analysis. Up with therapy today for the right knee.  DVT Prophylaxis - Foot Pumps, TED hose and  Pradaxa Weight-Bearing as tolerated to right leg  J. Cameron Proud, PA-C [facility] Orthopaedic Surgery 11/19/2017, 7:47 AM

## 2017-11-19 NOTE — Care Management Important Message (Signed)
Copy of signed IM left with patient in room.  

## 2017-11-19 NOTE — Discharge Summary (Signed)
[facility] [place] NAME: Colleen [name]    MR#:  [id number]  DATE OF BIRTH:  February 08, 1931  DATE OF ADMISSION:  11/16/2017   ADMITTING PHYSICIAN: Hillary Bow, MD  DATE OF DISCHARGE:  11/19/2017  PRIMARY CARE PHYSICIAN: Baxter Hire, MD   ADMISSION DIAGNOSIS:  Edema [R60.9] Acute respiratory failure with hypoxia ([place]) [J96.01] Sepsis, due to unspecified organism ([place]) [A41.9] DISCHARGE DIAGNOSIS:  Active Problems:   Sepsis ([place])  SECONDARY DIAGNOSIS:   Past Medical History:  Diagnosis Date  . Aortic valve disorder   . Arthritis   . Asthma   . Atrial fibrillation ([place])   . Breast cancer ([place]) 2004   LT MASTECTOMY  . Fibrocystic disease of both breasts   . GERD (gastroesophageal reflux disease)   . Hx of colonic polyps   . Hyperlipidemia   . Hypertension   . Malignant neoplasm of breast ([place])    left  . Osteoarthritis of right knee 2019  . Stroke [facility]) 2008   right peripheral vision is gone   HOSPITAL COURSE:   ShirleyHallis a84 y.o.femalewith a known history of paroxysmal atrial fibrillation, arthritis, hypertension, past tobacco use was recently in the hospital and had right total knee arthroplasty on 11/13/2017.Patient was discharged to skilled nursing facility yesterday.  She was found febrile and hypoxia in facility.  *Sepsis Etiology is unclear. CT scan of the chest shows no infiltrates. Could be atelectasis. No UTI. Right lower extremity venous ultrasound: No DVT. Continue incentive spirometer. Blood culture negative so far. She has been treated with vancomycin and cefepime until cultures are negative.  Per Dr. Roland Rack, he did aspiration of 1 cc fluid from the knee.  Follow-up culture as outpatient and antibiotics for 10 days. Change to Levaquin p.o. for 10 days.  *Acute hypoxic respiratory failure. Mild pulmonary edema on CT scan of the chest. Given 1 dose of IV Lasix. Could be due to fluidsduring  an after surgery. Improved.  *Hypertension. Continue atenolol, Lasix and spironolactone, hold lisinopril and nifedipine due to blood pressure.  Follow-up PCP for adjustment.  *Paroxysmal atrial fibrillation. On Pradaxa and atenolol.  Follow-up with cardiology as outpatient.  Hypokalemia.  Given potassium supplement. DISCHARGE CONDITIONS:  Stable, discharge to skilled nursing facility today. CONSULTS OBTAINED:  Treatment Team:  Lovell Sheehan, MD Leim Fabry, MD Yolonda Kida, MD DRUG ALLERGIES:   Allergies  Allergen Reactions  . Ibuprofen Swelling  . Codeine Anxiety    Hallucination.   DISCHARGE MEDICATIONS:   Allergies as of 11/19/2017      Reactions   Ibuprofen Swelling   Codeine Anxiety   Hallucination.      Medication List    STOP taking these medications   lisinopril 40 MG tablet Commonly known as:  PRINIVIL,ZESTRIL   NIFEdipine 30 MG 24 hr tablet Commonly known as:  PROCARDIA-XL/ADALAT-CC/NIFEDICAL-XL   oxyCODONE 5 MG immediate release tablet Commonly known as:  Oxy IR/ROXICODONE   traMADol 50 MG tablet Commonly known as:  ULTRAM     TAKE these medications   atenolol 50 MG tablet Commonly known as:  TENORMIN Take 50 mg by mouth 2 (two) times daily.   b complex vitamins tablet Take 1 tablet by mouth daily.   Biotin 1000 MCG tablet Take 1,000 mcg by mouth daily.   CALCIUM 600+D3 PO Take 1 tablet by mouth daily.   cetirizine 10 MG tablet Commonly known as:  ZYRTEC Take 10 mg by mouth daily.   FISH  OIL PO Take 1 capsule by mouth daily.   fluticasone 50 MCG/ACT nasal spray Commonly known as:  FLONASE Place 2 sprays into both nostrils daily.   folic acid 809 MCG tablet Commonly known as:  FOLVITE Take 400 mcg by mouth daily.   furosemide 20 MG tablet Commonly known as:  LASIX Take 20 mg by mouth daily.   levofloxacin 250 MG tablet Commonly known as:  LEVAQUIN Take 1 tablet (250 mg total) by mouth daily for 10 days. Start  taking on:  11/20/2017   montelukast 10 MG tablet Commonly known as:  SINGULAIR Take 10 mg by mouth at bedtime.   multivitamin with minerals Tabs tablet Take 1 tablet by mouth daily.   OSTEO BI-FLEX ADV TRIPLE ST PO Take 1 tablet by mouth 2 (two) times daily.   PRADAXA 150 MG Caps capsule Generic drug:  dabigatran Take 150 mg by mouth 2 (two) times daily.   rosuvastatin 10 MG tablet Commonly known as:  CRESTOR Take 10 mg by mouth at bedtime.   spironolactone 25 MG tablet Commonly known as:  ALDACTONE Take 25 mg by mouth daily.   vitamin C 500 MG tablet Commonly known as:  ASCORBIC ACID Take 1,000 mg by mouth daily.   VITAMIN D-400 400 units Tabs tablet Generic drug:  cholecalciferol Take 400 Units by mouth daily.   vitamin E 400 UNIT capsule Take 400 Units by mouth daily.        DISCHARGE INSTRUCTIONS:  See AVS.  If you experience worsening of your admission symptoms, develop shortness of breath, life threatening emergency, suicidal or homicidal thoughts you must seek medical attention immediately by calling 911 or calling your MD immediately  if symptoms less severe.  You Must read complete instructions/literature along with all the possible adverse reactions/side effects for all the Medicines you take and that have been prescribed to you. Take any new Medicines after you have completely understood and accpet all the possible adverse reactions/side effects.   Please note  You were cared for by a hospitalist during your hospital stay. If you have any questions about your discharge medications or the care you received while you were in the hospital after you are discharged, you can call the unit and asked to speak with the hospitalist on call if the hospitalist that took care of you is not available. Once you are discharged, your primary care physician will handle any further medical issues. Please note that NO REFILLS for any discharge medications will be authorized  once you are discharged, as it is imperative that you return to your primary care physician (or establish a relationship with a primary care physician if you do not have one) for your aftercare needs so that they can reassess your need for medications and monitor your lab values.    On the day of Discharge:  VITAL SIGNS:  Blood pressure (!) 112/56, pulse 90, temperature 98.2 F (36.8 C), temperature source Oral, resp. rate 16, height 5\' 1"  (1.549 m), weight 162 lb 6.4 oz (73.7 kg), SpO2 99 %. PHYSICAL EXAMINATION:  GENERAL:  84 y.o.-year-old patient lying in the bed with no acute distress.  EYES: Pupils equal, round, reactive to light and accommodation. No scleral icterus. Extraocular muscles intact.  HEENT: Head atraumatic, normocephalic.  NECK:  Supple, no jugular venous distention. No thyroid enlargement, no tenderness.  LUNGS: Normal breath sounds bilaterally, no wheezing, rales,rhonchi or crepitation. No use of accessory muscles of respiration.  CARDIOVASCULAR: S1, S2 normal. No murmurs, rubs,  or gallops.  ABDOMEN: Soft, non-tender, non-distended. Bowel sounds present. No organomegaly or mass.  EXTREMITIES: No pedal edema, cyanosis, or clubbing.  Right knee in dressing. NEUROLOGIC: Cranial nerves II through XII are intact. Muscle strength 4/5 in all extremities. Sensation intact. Gait not checked.  PSYCHIATRIC: The patient is alert and oriented x 3.  SKIN: No obvious rash, lesion, or ulcer.  DATA REVIEW:   CBC Recent Labs  Lab 11/19/17 0354  WBC 7.9  HGB 10.5*  HCT 29.6*  PLT 220    Chemistries  Recent Labs  Lab 11/16/17 1503  11/18/17 0459  NA 134*   < > 140  K 3.9   < > 3.4*  CL 98   < > 100  CO2 27   < > 32  GLUCOSE 103*   < > 111*  BUN 13   < > 14  CREATININE 0.80   < > 0.96  CALCIUM 9.1   < > 8.7*  MG  --   --  2.6*  AST 30  --   --   ALT 10  --   --   ALKPHOS 20*  --   --   BILITOT 1.6*  --   --    < > = values in this interval not displayed.      Microbiology Results  Results for orders placed or performed during the hospital encounter of 11/16/17  Blood Culture (routine x 2)     Status: None (Preliminary result)   Collection Time: 11/16/17  3:03 PM  Result Value Ref Range Status   Specimen Description BLOOD  Final   Special Requests NONE  Final   Culture   Final    NO GROWTH 3 DAYS Performed at [facility], [address], [place], [place] [address]    Report Status PENDING  Incomplete  Blood Culture (routine x 2)     Status: None (Preliminary result)   Collection Time: 11/16/17  3:03 PM  Result Value Ref Range Status   Specimen Description BLOOD RIGHT ARM  Final   Special Requests   Final    BOTTLES DRAWN AEROBIC AND ANAEROBIC Blood Culture adequate volume   Culture   Final    NO GROWTH 3 DAYS Performed at [facility], [address] [place], [place] [address]    Report Status PENDING  Incomplete  Urine culture     Status: None   Collection Time: 11/16/17 10:48 PM  Result Value Ref Range Status   Specimen Description   Final    URINE, RANDOM Performed at [facility], [address], [place], [place] [address]    Special Requests   Final    NONE Performed at [facility], [address], [place], [place] [address]    Culture   Final    NO GROWTH Performed at [facility], [place] [address], [place], [place] [address]    Report Status 11/18/2017 FINAL  Final    RADIOLOGY:  No results found.   Management plans discussed with the patient, family and they are in agreement.  CODE STATUS: Full Code   TOTAL TIME TAKING CARE OF THIS PATIENT: 35 minutes.    Demetrios Loll M.D on 11/19/2017 at 8:33 AM  Between 7am to 6pm - Pager - [phone]  After 6pm go to [email or web address] - Proofreader  [facility]  Office  [phone]  CC: Primary care physician; Baxter Hire, MD   Note: This  dictation was prepared with Dragon  dictation along with smaller phrase technology. Any transcriptional errors that result from this process are unintentional.

## 2017-11-19 NOTE — Progress Notes (Signed)
Pt alert and oriented. No complaints of pain during the night. Pt still remaining on 2L of oxygen. No complaints of shortness of breath.

## 2017-11-19 NOTE — Consult Note (Signed)
Pharmacy Antibiotic Note  Colleen [name] is a 84 y.o. female admitted on 11/16/2017 with fever/hypoxia post d/c from ortho surgery.  Pharmacy has been consulted for levofloxacin dosing.  Plan: All workout and source of fever has been neg. Pt has been on vancomycin/cefepime. Pharmacy now consulted to changed to levofloxacin. Levofloxacin 500mg  once then 250mg  daily  Height: 5\' 1"  (154.9 cm) Weight: 162 lb 6.4 oz (73.7 kg) IBW/kg (Calculated) : 47.8  Temp (24hrs), Avg:98.2 F (36.8 C), Min:98.2 F (36.8 C), Max:98.3 F (36.8 C)  Recent Labs  Lab 11/15/17 0611 11/16/17 0459 11/16/17 1503 11/16/17 1746 11/17/17 0710 11/18/17 0459 11/19/17 0354  WBC 12.6* 10.8 11.3*  --  9.2  --  7.9  CREATININE 0.80 0.86 0.80  --  0.89 0.96  --   LATICACIDVEN  --   --  1.4 1.0  --   --   --   VANCOTROUGH  --   --   --   --   --   --  19    Estimated Creatinine Clearance: 39.4 mL/min (by C-G formula based on SCr of 0.96 mg/dL).    Allergies  Allergen Reactions  . Ibuprofen Swelling  . Codeine Anxiety    Hallucination.    Antimicrobials this admission: Vancomycin 7/12 >>  7/15 Cefepime 7/12 >> 7/15 Levofloxacin 7/15>>    Dose adjustments this admission:   Microbiology results: Recent Results (from the past 240 hour(s))  Blood Culture (routine x 2)     Status: None (Preliminary result)   Collection Time: 11/16/17  3:03 PM  Result Value Ref Range Status   Specimen Description BLOOD  Final   Special Requests NONE  Final   Culture   Final    NO GROWTH 3 DAYS Performed at [facility], [address] [place], [place] [address]    Report Status PENDING  Incomplete  Blood Culture (routine x 2)     Status: None (Preliminary result)   Collection Time: 11/16/17  3:03 PM  Result Value Ref Range Status   Specimen Description BLOOD RIGHT ARM  Final   Special Requests   Final    BOTTLES DRAWN AEROBIC AND ANAEROBIC Blood Culture adequate volume   Culture   Final    NO GROWTH  3 DAYS Performed at [facility], [address] [place], [place] [address]    Report Status PENDING  Incomplete  Urine culture     Status: None   Collection Time: 11/16/17 10:48 PM  Result Value Ref Range Status   Specimen Description   Final    URINE, RANDOM Performed at [facility], [address], [place], [place] [address]    Special Requests   Final    NONE Performed at [facility], [address], [place], [place] [address]    Culture   Final    NO GROWTH Performed at [facility], [place] [address] [place], [place] [address]    Report Status 11/18/2017 FINAL  Final     Thank you for allowing pharmacy to be a part of this patient's care.  Ramond Dial, Pharm.D, BCPS Clinical Pharmacist 11/19/2017 7:54 AM

## 2017-11-19 NOTE — Progress Notes (Signed)
Pharmacy Antibiotic Note  Colleen [name] is a 83 y.o. female admitted on 11/16/2017 with sepsis.  Pharmacy has been consulted for Vancomycin and Cefepime dosing.  Plan: Vancomycin 1g IV every 18 hours.  Goal trough 15-20 mcg/mL. Will check a trough level prior to 5th dose.  Cefepime 2g IV q12h  7/15:  Vanc trough @ 0354 = 19 mcg/mL Will continue this pt on Vanc 1 gm IV Q18H .   Height: 5\' 1"  (154.9 cm) Weight: 158 lb 3.2 oz (71.8 kg) IBW/kg (Calculated) : 47.8  Temp (24hrs), Avg:98.1 F (36.7 C), Min:97.8 F (36.6 C), Max:98.3 F (36.8 C)  Recent Labs  Lab 11/15/17 0611 11/16/17 0459 11/16/17 1503 11/16/17 1746 11/17/17 0710 11/18/17 0459 11/19/17 0354  WBC 12.6* 10.8 11.3*  --  9.2  --  7.9  CREATININE 0.80 0.86 0.80  --  0.89 0.96  --   LATICACIDVEN  --   --  1.4 1.0  --   --   --   VANCOTROUGH  --   --   --   --   --   --  19    Estimated Creatinine Clearance: 38.8 mL/min (by C-G formula based on SCr of 0.96 mg/dL).    Allergies  Allergen Reactions  . Ibuprofen Swelling  . Codeine Anxiety    Hallucination.    Antimicrobials this admission: Vancomycin 7/12 >>  Cefepime 7/12 >>   Thank you for allowing pharmacy to be a part of this patient's care.  Mateja Dier D 11/19/2017 4:21 AM

## 2017-11-19 NOTE — Progress Notes (Signed)
Patient is being discharged to [facility]. IV removed with cath intact. Report called. Small BM before discharge. VSS On RA.

## 2017-11-19 NOTE — Progress Notes (Addendum)
Patient is medically stable for D/C to [facility] today. Per [facility] admissions coordinator at [facility] patient can come today to room 204-A. RN will call report at [phone] and arrange EMS for transport. Clinical Education officer, museum (CSW) sent D/C orders to [employer] via [employer]. Patient is aware of above. CSW left patient's neighbor Barnabas Lister a Advertising account executive. Please reconsult if future social work needs arise. CSW signing off.   Patient's neighbor Barnabas Lister called CSW back and was made aware of above.   [employer], LCSW [phone]

## 2017-11-21 LAB — CULTURE, BLOOD (ROUTINE X 2)
CULTURE: NO GROWTH
CULTURE: NO GROWTH
SPECIAL REQUESTS: ADEQUATE

## 2017-11-22 LAB — BODY FLUID CULTURE
Culture: NO GROWTH
Special Requests: NORMAL

## 2017-11-26 ENCOUNTER — Other Ambulatory Visit: Payer: Self-pay | Admitting: *Deleted

## 2017-11-26 LAB — BLOOD GAS, VENOUS
Acid-Base Excess: 2.7 mmol/L — ABNORMAL HIGH (ref 0.0–2.0)
Bicarbonate: 28.9 mmol/L — ABNORMAL HIGH (ref 20.0–28.0)
O2 Saturation: UNDETERMINED %
PCO2 VEN: 50 mmHg (ref 44.0–60.0)
PH VEN: 7.37 (ref 7.250–7.430)
Patient temperature: 37

## 2017-11-26 MED ORDER — TRAMADOL HCL 50 MG PO TABS: ORAL_TABLET | ORAL | 0 refills | Status: DC

## 2017-11-26 NOTE — Telephone Encounter (Signed)
[facility] [facility]-OKed by Bard Herbert.

## 2017-11-29 ENCOUNTER — Encounter: Payer: Self-pay | Admitting: Adult Health

## 2017-11-29 NOTE — Progress Notes (Signed)
Opened in error; Disregard.

## 2018-07-28 IMAGING — CT CT ANGIO CHEST
2 of 7 series · 19 of 46 positions shown · IV contrast (APPLIED)
Comparison: Chest x-ray dated 10/31/2017

CLINICAL DATA: Fever and shortness of breath. Right calf pain.
Recent right total knee replacement.

EXAM:
CT ANGIOGRAPHY CHEST WITH CONTRAST
TECHNIQUE: Multidetector CT imaging of the chest was performed using the
standard protocol during bolus administration of intravenous
contrast. Multiplanar CT image reconstructions and MIPs were
obtained to evaluate the vascular anatomy.
CONTRAST:  75mL 2OSJLE-ERV IOPAMIDOL (2OSJLE-ERV) INJECTION 76%

[Series 5: thins · axial · 0.58mm/px · z∈[-205,+54]mm · 16 of 291 slices shown]
[im 16/291  lung]
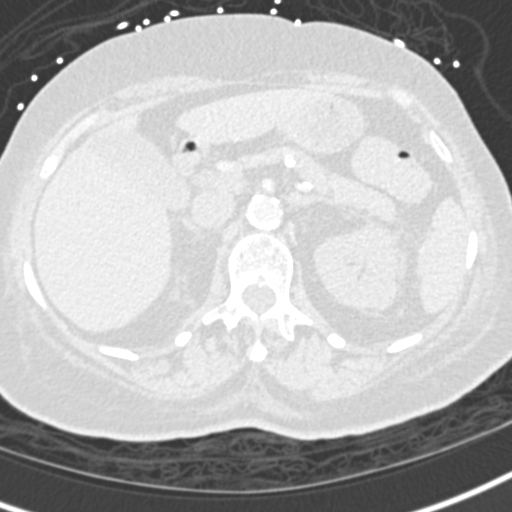
[im 31/291  soft-tissue]
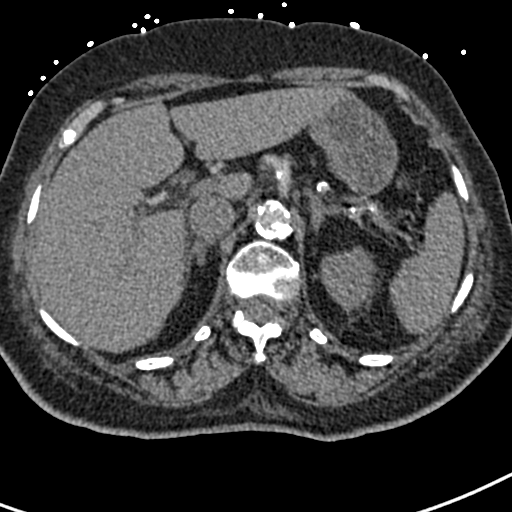
[im 46/291  lung]
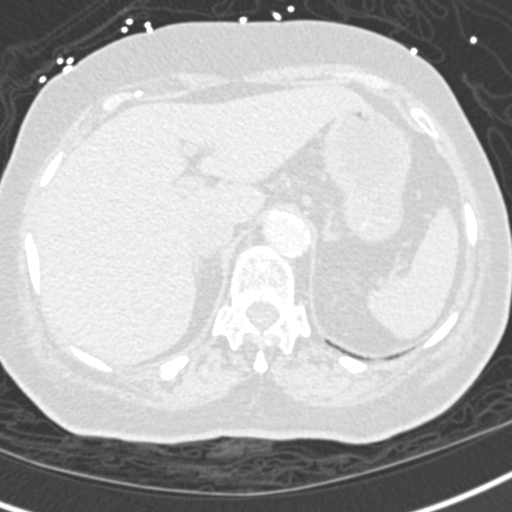
[im 62/291  soft-tissue]
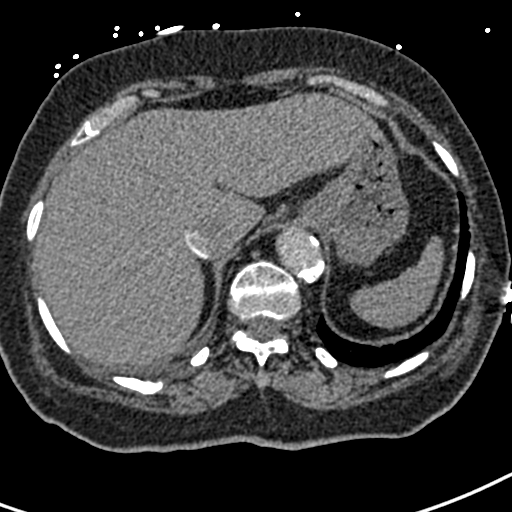
[im 92/291  lung]
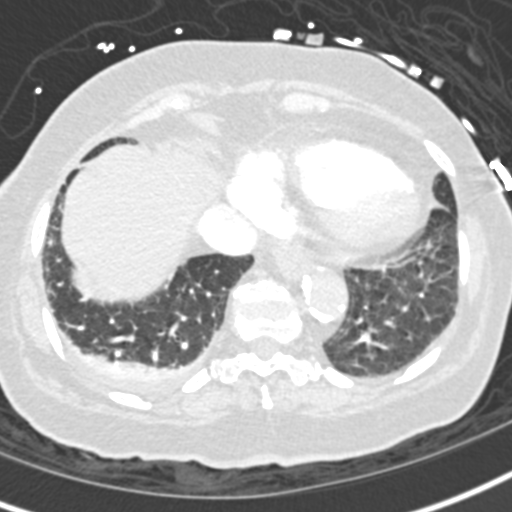
[im 107/291  soft-tissue]
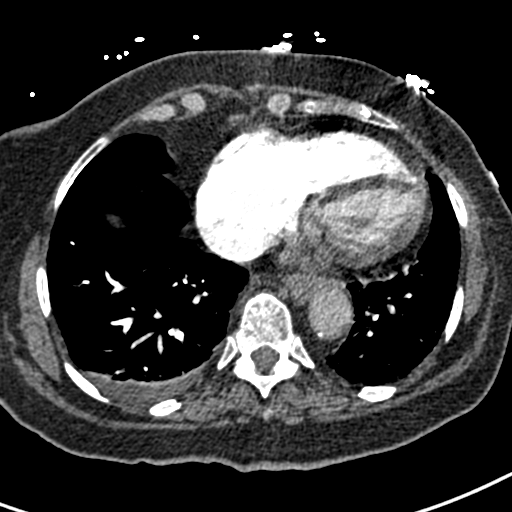
[im 123/291  lung]
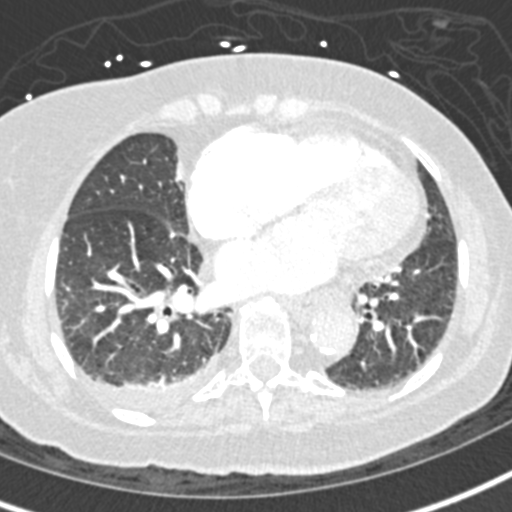
[im 138/291  soft-tissue]
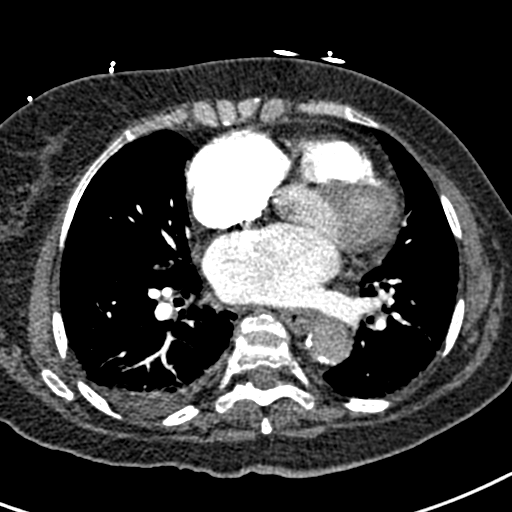
[im 153/291  lung]
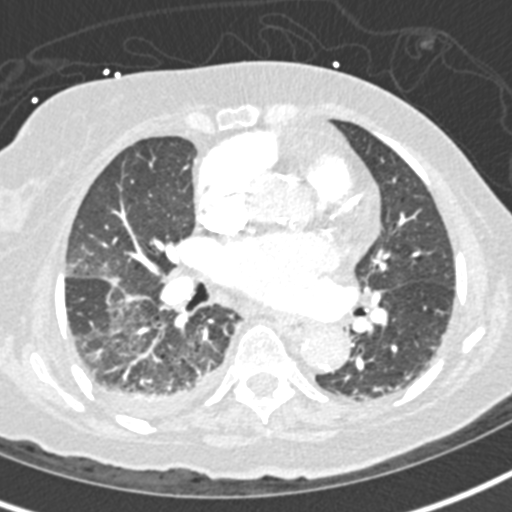
[im 168/291  soft-tissue]
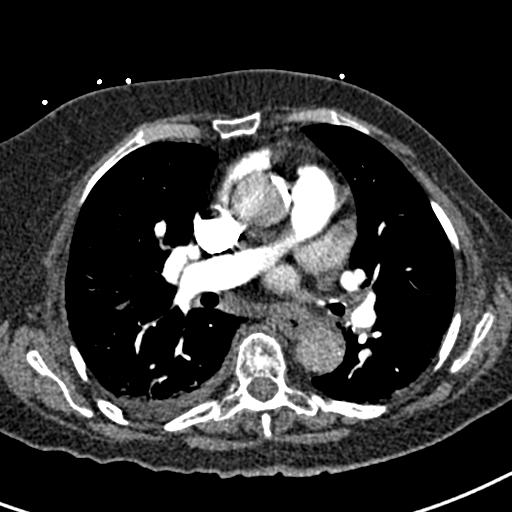
[im 184/291  lung]
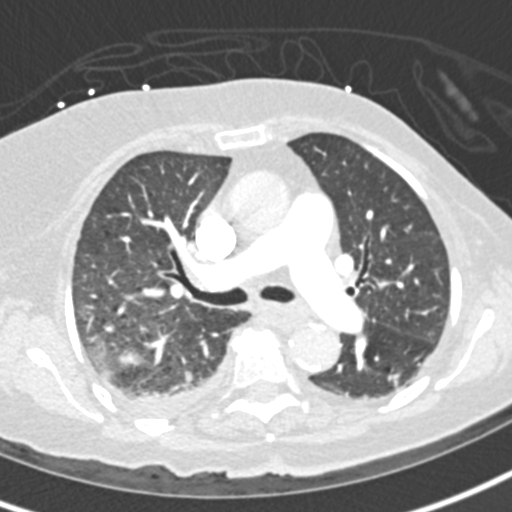
[im 199/291  soft-tissue]
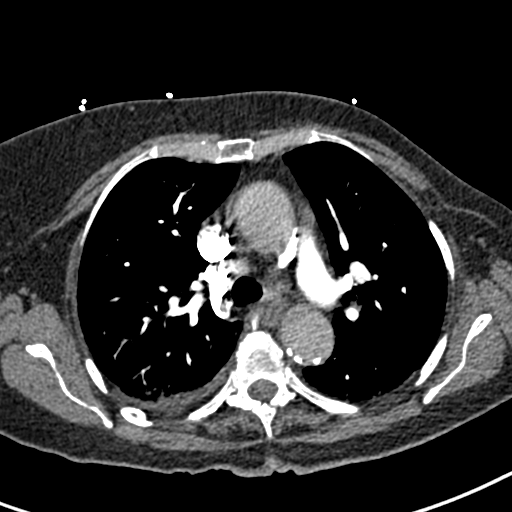
[im 229/291  lung]
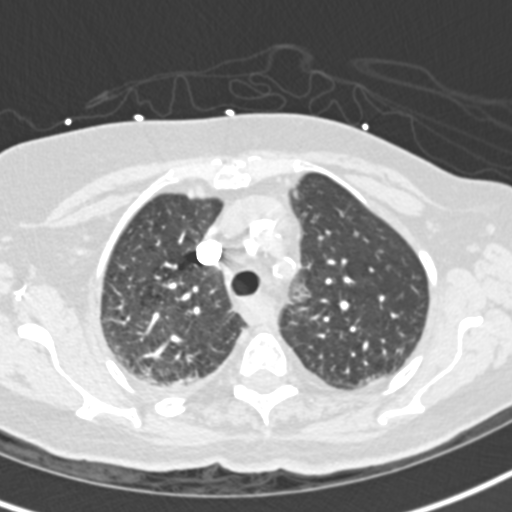
[im 245/291  soft-tissue]
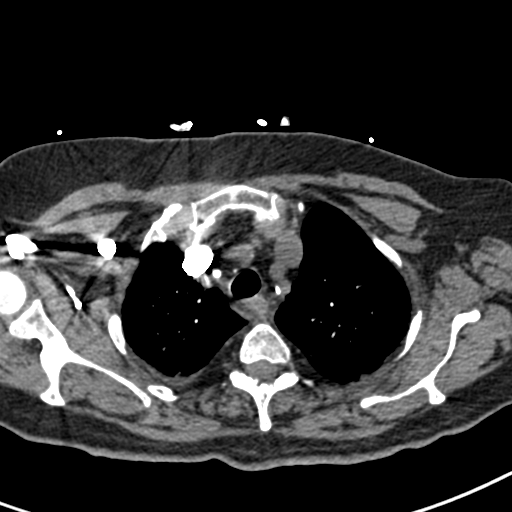
[im 260/291  lung]
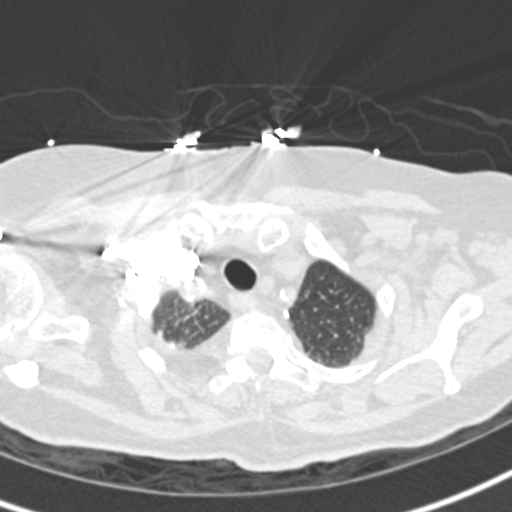
[im 275/291  soft-tissue]
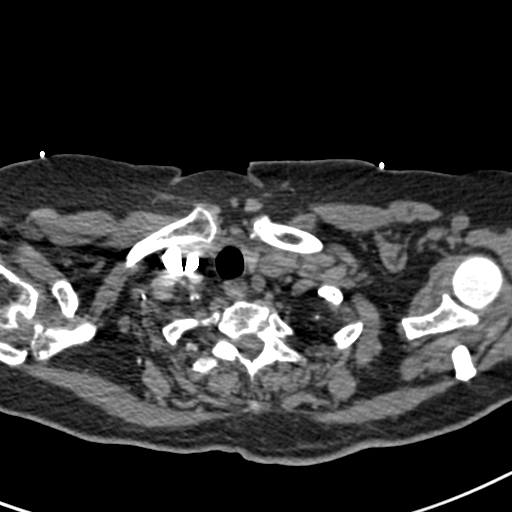

[Series 7: coronal mpr · coronal · 0.55mm/px · 3 of 72 slices shown]
[im 18/72  soft-tissue]
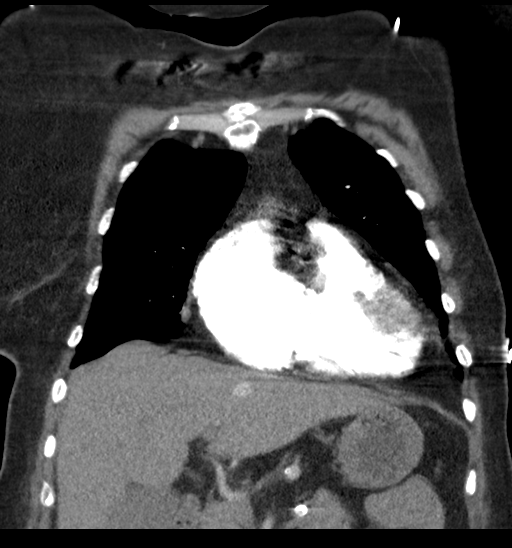
[im 36/72  soft-tissue]
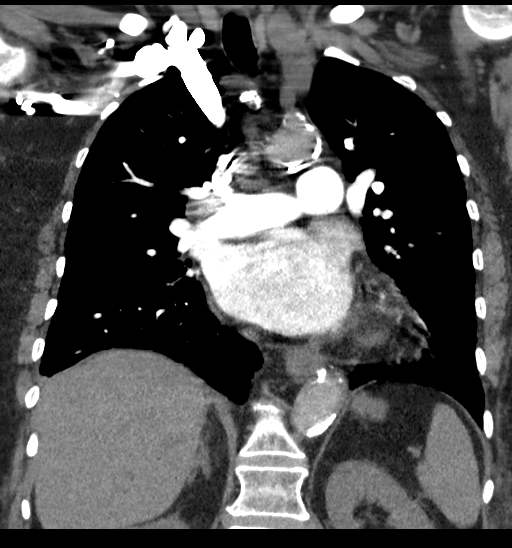
[im 54/72  soft-tissue]
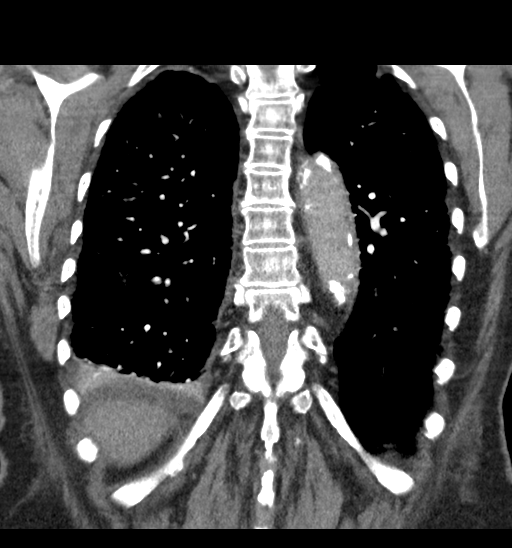

[19 of 46 positions shown; findings below may reference images not displayed]

FINDINGS: Cardiovascular: Mild cardiomegaly. No pulmonary emboli. Aortic
atherosclerosis. Coronary artery calcifications. No pericardial
effusion.

Mediastinum/Nodes: Slight prominence of the mucosa throughout the
esophagus. No appreciable hiatal hernia.

Lungs/Pleura: There is a small right pleural effusion with slight
interstitial edema dependent portions of the lobes of the right lung
with a small amount of fluid along the fissures. Minimal atelectasis
at the right lung base secondary to the fusion.

Upper Abdomen: No acute abnormality. Extensive aortic
atherosclerosis.

Musculoskeletal: No acute abnormalities.

Review of the MIP images confirms the above findings.
IMPRESSION: 1. No pulmonary emboli.
2. Mild cardiomegaly with a small right pleural effusion and slight
dependent pulmonary edema in the right lung. This could represent
mild congestive heart failure.
3.  Aortic Atherosclerosis ([id number].X).
4. Slight prominence of the mucosa of esophagus diffusely. This
could represent esophagitis.

## 2018-09-23 ENCOUNTER — Other Ambulatory Visit: Payer: Self-pay | Admitting: Internal Medicine

## 2018-09-23 DIAGNOSIS — Z1231 Encounter for screening mammogram for malignant neoplasm of breast: Secondary | ICD-10-CM

## 2018-10-31 ENCOUNTER — Other Ambulatory Visit: Payer: Self-pay

## 2018-10-31 ENCOUNTER — Other Ambulatory Visit: Payer: Self-pay | Admitting: Internal Medicine

## 2018-10-31 ENCOUNTER — Ambulatory Visit
Admission: RE | Admit: 2018-10-31 | Discharge: 2018-10-31 | Disposition: A | Discharge status: Home / Self Care | Payer: Medicare Other | Source: Ambulatory Visit | Attending: Internal Medicine | Admitting: Internal Medicine

## 2018-10-31 DIAGNOSIS — Z1231 Encounter for screening mammogram for malignant neoplasm of breast: Secondary | ICD-10-CM

## 2019-09-22 ENCOUNTER — Other Ambulatory Visit: Payer: Self-pay | Admitting: Infectious Diseases

## 2019-09-22 DIAGNOSIS — Z1231 Encounter for screening mammogram for malignant neoplasm of breast: Secondary | ICD-10-CM

## 2019-11-04 ENCOUNTER — Ambulatory Visit
Admission: RE | Admit: 2019-11-04 | Discharge: 2019-11-04 | Disposition: A | Discharge status: Home / Self Care | Payer: Medicare Other | Source: Ambulatory Visit | Attending: Infectious Diseases | Admitting: Infectious Diseases

## 2019-11-04 DIAGNOSIS — Z1231 Encounter for screening mammogram for malignant neoplasm of breast: Secondary | ICD-10-CM | POA: Insufficient documentation

## 2020-03-30 ENCOUNTER — Other Ambulatory Visit: Payer: Self-pay | Admitting: Internal Medicine

## 2020-03-30 ENCOUNTER — Ambulatory Visit: Payer: Medicare Other | Attending: Internal Medicine

## 2020-03-30 DIAGNOSIS — Z23 Encounter for immunization: Secondary | ICD-10-CM

## 2020-03-30 NOTE — Progress Notes (Signed)
° °  Covid-19 Vaccination Clinic  Name:  Colleen [name]    MRN: [id number] DOB: [birth year]  03/30/2020  [name] was observed post Covid-19 immunization for 15 minutes without incident. She was provided with Vaccine Information Sheet and instruction to access the V-Safe system.   [name] was instructed to call 911 with any severe reactions post vaccine:  Difficulty breathing   Swelling of face and throat   A fast heartbeat   A bad rash all over body   Dizziness and weakness   Immunizations Administered    Name Date Dose VIS Date Route   Pfizer COVID-19 Vaccine 03/30/2020 11:24 AM 0.3 mL 02/25/2020 Intramuscular   Manufacturer: [place]   Lot: [id number]   [place]: [id number]

## 2020-06-30 IMAGING — US US EXTREM LOW VENOUS*R*
1 series · 13 of 24 positions shown · non-contrast
Comparison: None.

CLINICAL DATA: Right lower extremity pain and edema. History of
right knee replacement approximately 3 days ago. History of breast
cancer. Former smoker. Evaluate for DVT.



[Series 1: us extrem low venous*right* · 0.08mm/px · 13 of 35 slices shown]
[im 1/35]
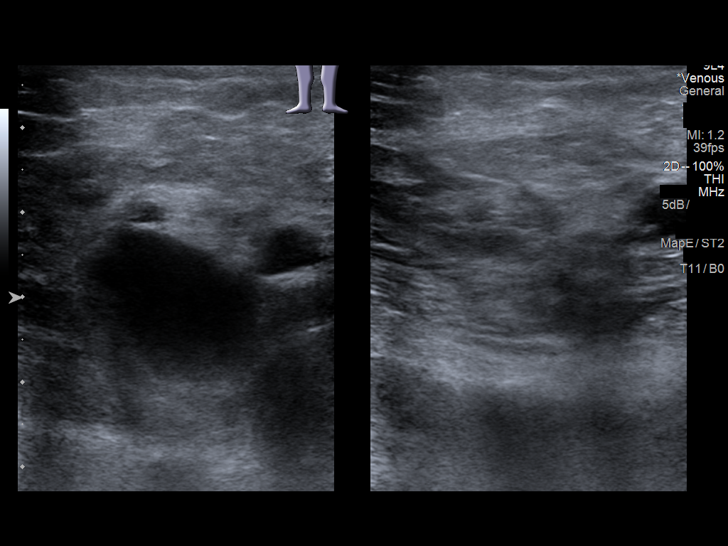
[im 3/35]
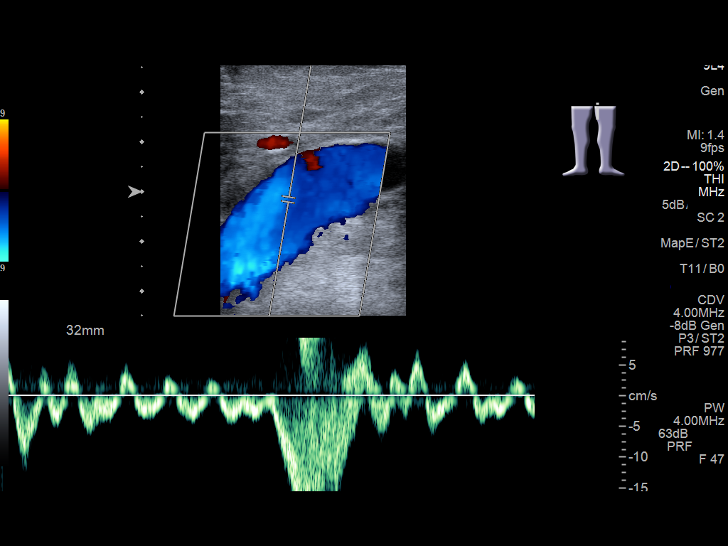
[im 6/35]
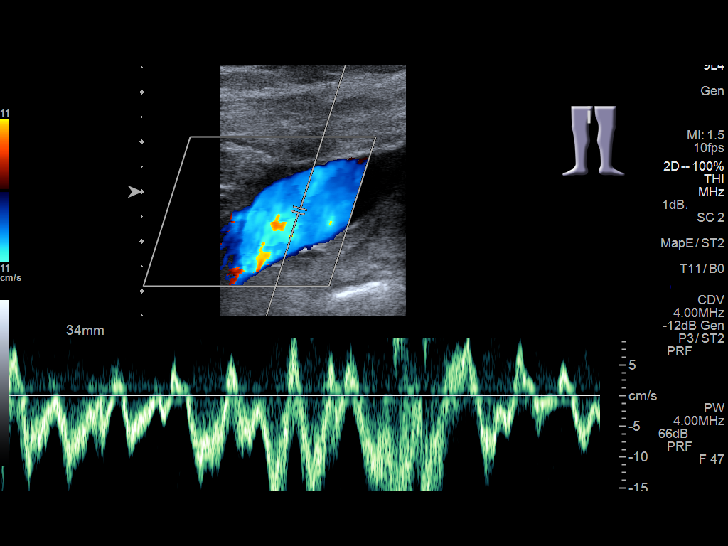
[im 9/35]
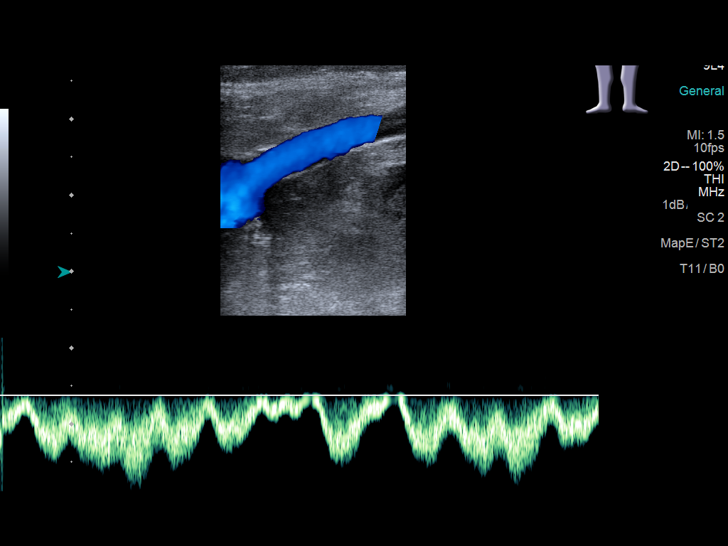
[im 12/35]
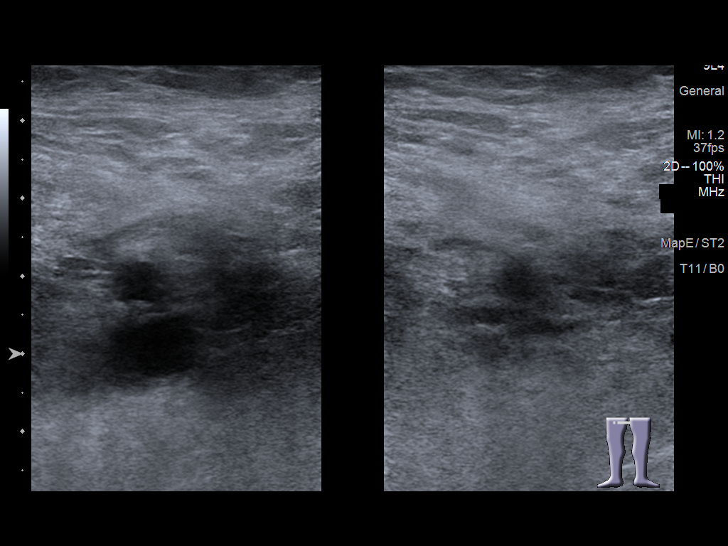
[im 15/35]
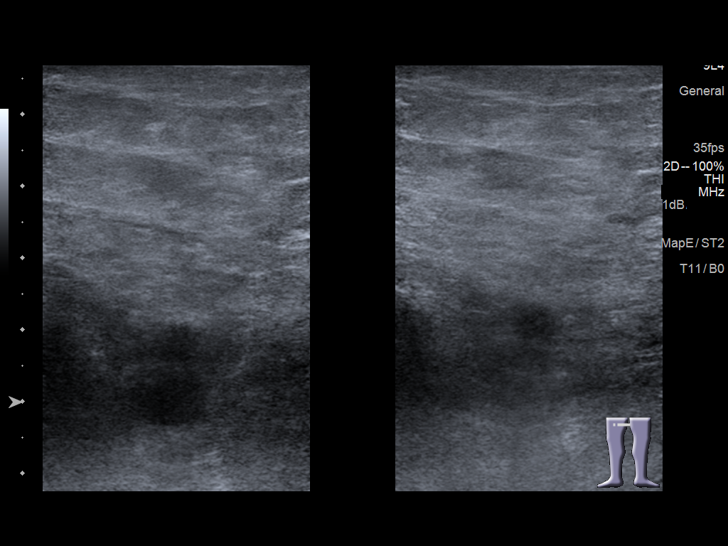
[im 18/35]
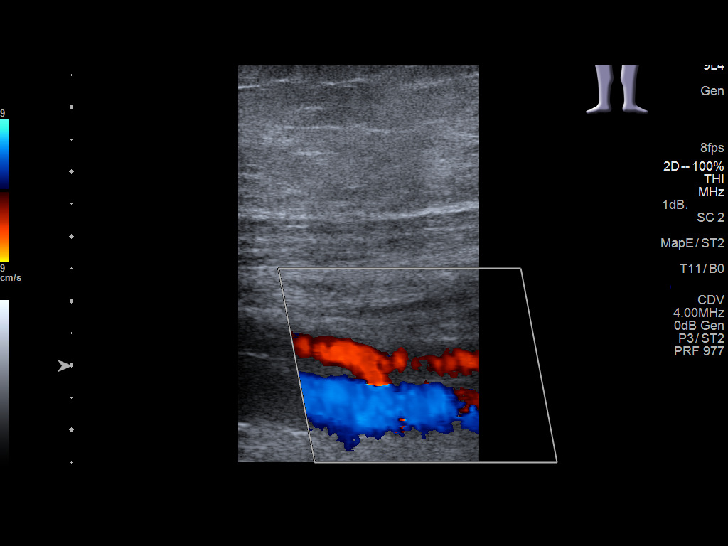
[im 20/35]
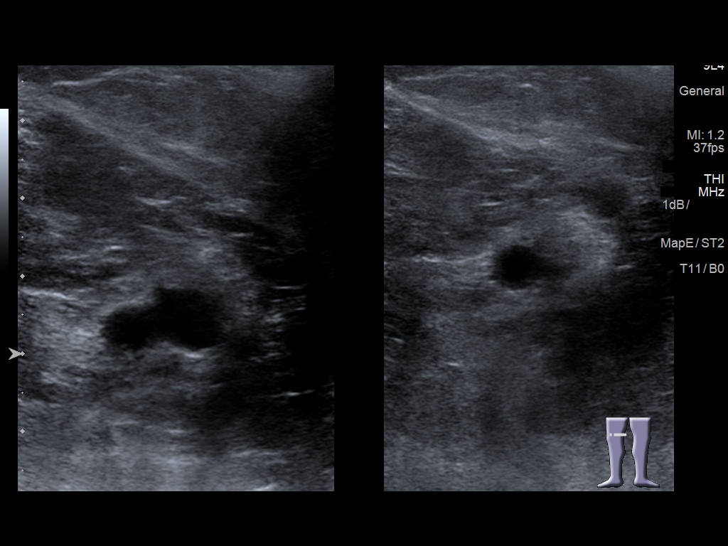
[im 23/35]
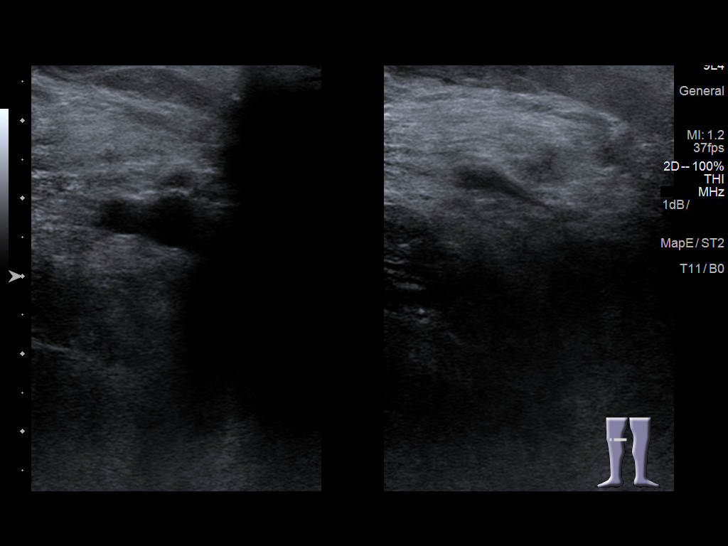
[im 26/35]
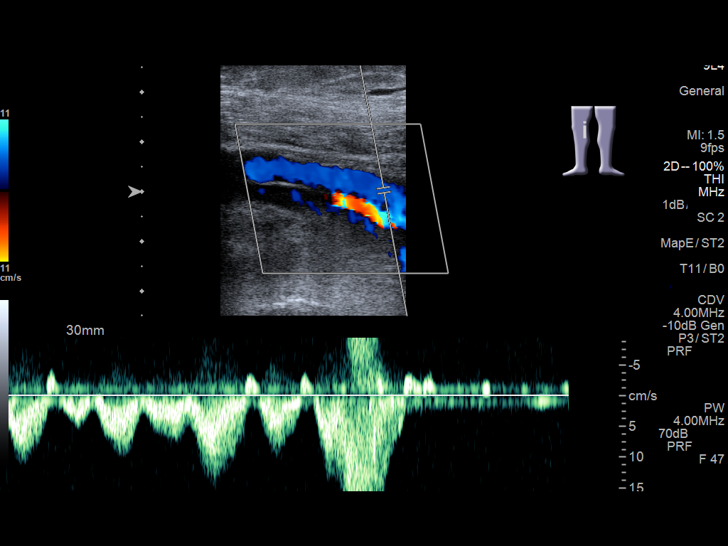
[im 29/35]
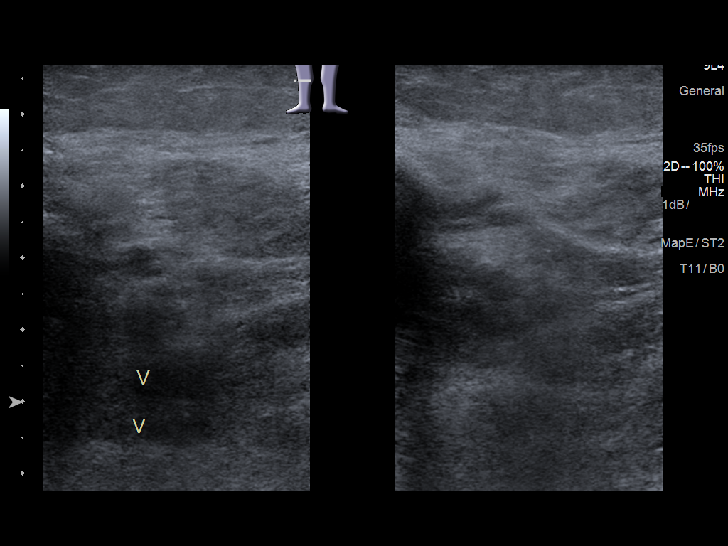
[im 32/35]
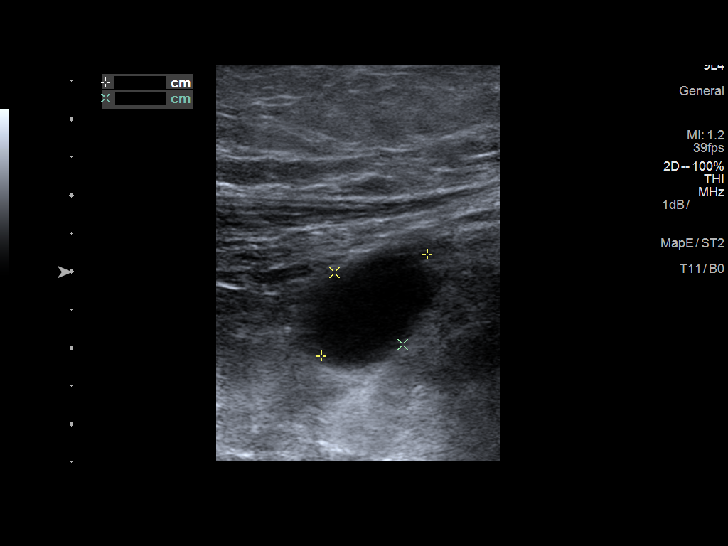
[im 35/35]
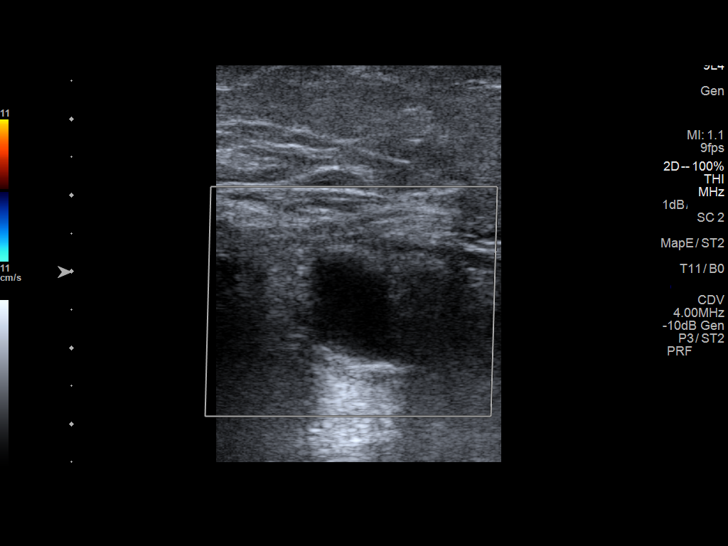

[13 of 24 positions shown; findings below may reference images not displayed]

FINDINGS: Contralateral Common Femoral Vein: Respiratory phasicity is normal
and symmetric with the symptomatic side. No evidence of thrombus.
Normal compressibility.

Common Femoral Vein: No evidence of thrombus. Normal
compressibility, respiratory phasicity and response to augmentation.

Saphenofemoral Junction: No evidence of thrombus. Normal
compressibility and flow on color Doppler imaging.

Profunda Femoral Vein: No evidence of thrombus. Normal
compressibility and flow on color Doppler imaging.

Femoral Vein: No evidence of thrombus. Normal compressibility,
respiratory phasicity and response to augmentation.

Popliteal Vein: No evidence of thrombus. Normal compressibility,
respiratory phasicity and response to augmentation.

Calf Veins: No evidence of thrombus. Normal compressibility and flow
on color Doppler imaging.

Superficial Great Saphenous Vein: No evidence of thrombus. Normal
compressibility.

Venous Reflux:  None.

Other Findings: Note is made of an approximately 1.9 x 1.3 x 1.4 cm
anechoic fluid collection with the right popliteal fossa favored to
represent a Baker cyst.
IMPRESSION: No evidence of DVT within the right lower extremity.

## 2020-08-16 ENCOUNTER — Ambulatory Visit: Payer: Medicare Other

## 2020-08-17 ENCOUNTER — Other Ambulatory Visit: Payer: Self-pay

## 2020-08-17 ENCOUNTER — Ambulatory Visit: Payer: Medicare Other | Attending: Internal Medicine

## 2020-08-17 DIAGNOSIS — Z23 Encounter for immunization: Secondary | ICD-10-CM

## 2020-08-17 NOTE — Progress Notes (Signed)
   Covid-19 Vaccination Clinic  Name:  Colleen [name]    MRN: [id number] DOB: 04/14/1931  08/17/2020  Colleen [name] was observed post Covid-19 immunization for 15 minutes without incident. She was provided with Vaccine Information Sheet and instruction to access the V-Safe system.   Colleen [name] was instructed to call 911 with any severe reactions post vaccine: Marland Kitchen Difficulty breathing  . Swelling of face and throat  . A fast heartbeat  . A bad rash all over body  . Dizziness and weakness   Immunizations Administered    Name Date Dose VIS Date Route   PFIZER Comrnaty(Gray TOP) Covid-19 Vaccine 08/17/2020  1:23 PM 0.3 mL 04/15/2020 Intramuscular   Manufacturer: [employer]   Lot: [id number]   [place]: [phone]

## 2020-08-18 ENCOUNTER — Other Ambulatory Visit: Payer: Self-pay

## 2020-08-18 MED ORDER — PFIZER-BIONT COVID-19 VAC-TRIS 30 MCG/0.3ML IM SUSP
INTRAMUSCULAR | 0 refills | Status: AC
  Filled 2020-08-18: qty 0.3, 20d supply, fill #0

## 2020-08-20 ENCOUNTER — Other Ambulatory Visit: Payer: Self-pay

## 2020-09-29 ENCOUNTER — Other Ambulatory Visit: Payer: Self-pay | Admitting: Infectious Diseases

## 2020-09-29 DIAGNOSIS — Z1231 Encounter for screening mammogram for malignant neoplasm of breast: Secondary | ICD-10-CM

## 2020-11-04 ENCOUNTER — Other Ambulatory Visit: Payer: Self-pay

## 2020-11-04 ENCOUNTER — Ambulatory Visit
Admission: RE | Admit: 2020-11-04 | Discharge: 2020-11-04 | Disposition: A | Discharge status: Home / Self Care | Payer: Medicare Other | Source: Ambulatory Visit | Attending: Infectious Diseases | Admitting: Infectious Diseases

## 2020-11-04 DIAGNOSIS — Z1231 Encounter for screening mammogram for malignant neoplasm of breast: Secondary | ICD-10-CM | POA: Insufficient documentation

## 2021-10-24 ENCOUNTER — Other Ambulatory Visit: Payer: Self-pay | Admitting: Infectious Diseases

## 2021-10-24 DIAGNOSIS — Z1231 Encounter for screening mammogram for malignant neoplasm of breast: Secondary | ICD-10-CM

## 2021-11-21 ENCOUNTER — Ambulatory Visit
Admission: RE | Admit: 2021-11-21 | Discharge: 2021-11-21 | Disposition: A | Discharge status: Home / Self Care | Payer: Medicare Other | Source: Ambulatory Visit | Attending: Infectious Diseases | Admitting: Infectious Diseases

## 2021-11-21 DIAGNOSIS — Z1231 Encounter for screening mammogram for malignant neoplasm of breast: Secondary | ICD-10-CM | POA: Insufficient documentation

## 2022-10-17 ENCOUNTER — Other Ambulatory Visit: Payer: Self-pay

## 2022-10-17 DIAGNOSIS — Z1231 Encounter for screening mammogram for malignant neoplasm of breast: Secondary | ICD-10-CM

## 2022-11-23 ENCOUNTER — Ambulatory Visit
Admission: RE | Admit: 2022-11-23 | Discharge: 2022-11-23 | Disposition: A | Discharge status: Home / Self Care | Payer: Medicare Other | Source: Ambulatory Visit | Attending: Infectious Diseases | Admitting: Infectious Diseases

## 2022-11-23 DIAGNOSIS — Z1231 Encounter for screening mammogram for malignant neoplasm of breast: Secondary | ICD-10-CM | POA: Diagnosis present

## 2022-12-31 ENCOUNTER — Inpatient Hospital Stay
Admission: EM | Admit: 2022-12-31 | Discharge: 2023-01-03 | DRG: 536 | Disposition: A | Discharge status: Skilled Nursing Facility | Payer: Medicare Other | Attending: Internal Medicine | Admitting: Internal Medicine

## 2022-12-31 ENCOUNTER — Emergency Department: Payer: Medicare Other

## 2022-12-31 ENCOUNTER — Other Ambulatory Visit: Payer: Self-pay

## 2022-12-31 DIAGNOSIS — S32501A Unspecified fracture of right pubis, initial encounter for closed fracture: Secondary | ICD-10-CM | POA: Diagnosis not present

## 2022-12-31 DIAGNOSIS — N1831 Chronic kidney disease, stage 3a: Secondary | ICD-10-CM | POA: Diagnosis present

## 2022-12-31 DIAGNOSIS — I5032 Chronic diastolic (congestive) heart failure: Secondary | ICD-10-CM | POA: Diagnosis present

## 2022-12-31 DIAGNOSIS — Y92009 Unspecified place in unspecified non-institutional (private) residence as the place of occurrence of the external cause: Secondary | ICD-10-CM

## 2022-12-31 DIAGNOSIS — Z961 Presence of intraocular lens: Secondary | ICD-10-CM | POA: Diagnosis present

## 2022-12-31 DIAGNOSIS — Z833 Family history of diabetes mellitus: Secondary | ICD-10-CM

## 2022-12-31 DIAGNOSIS — I639 Cerebral infarction, unspecified: Secondary | ICD-10-CM | POA: Diagnosis present

## 2022-12-31 DIAGNOSIS — Z8249 Family history of ischemic heart disease and other diseases of the circulatory system: Secondary | ICD-10-CM

## 2022-12-31 DIAGNOSIS — Z823 Family history of stroke: Secondary | ICD-10-CM

## 2022-12-31 DIAGNOSIS — E785 Hyperlipidemia, unspecified: Secondary | ICD-10-CM | POA: Diagnosis present

## 2022-12-31 DIAGNOSIS — I10 Essential (primary) hypertension: Secondary | ICD-10-CM | POA: Diagnosis present

## 2022-12-31 DIAGNOSIS — S32591A Other specified fracture of right pubis, initial encounter for closed fracture: Principal | ICD-10-CM | POA: Diagnosis present

## 2022-12-31 DIAGNOSIS — I482 Chronic atrial fibrillation, unspecified: Secondary | ICD-10-CM | POA: Diagnosis not present

## 2022-12-31 DIAGNOSIS — E876 Hypokalemia: Secondary | ICD-10-CM | POA: Diagnosis present

## 2022-12-31 DIAGNOSIS — I2510 Atherosclerotic heart disease of native coronary artery without angina pectoris: Secondary | ICD-10-CM | POA: Diagnosis present

## 2022-12-31 DIAGNOSIS — S329XXA Fracture of unspecified parts of lumbosacral spine and pelvis, initial encounter for closed fracture: Secondary | ICD-10-CM | POA: Insufficient documentation

## 2022-12-31 DIAGNOSIS — W1830XA Fall on same level, unspecified, initial encounter: Secondary | ICD-10-CM | POA: Diagnosis present

## 2022-12-31 DIAGNOSIS — Z885 Allergy status to narcotic agent status: Secondary | ICD-10-CM

## 2022-12-31 DIAGNOSIS — Z9842 Cataract extraction status, left eye: Secondary | ICD-10-CM

## 2022-12-31 DIAGNOSIS — Z96651 Presence of right artificial knee joint: Secondary | ICD-10-CM | POA: Diagnosis present

## 2022-12-31 DIAGNOSIS — Z9841 Cataract extraction status, right eye: Secondary | ICD-10-CM

## 2022-12-31 DIAGNOSIS — Z8673 Personal history of transient ischemic attack (TIA), and cerebral infarction without residual deficits: Secondary | ICD-10-CM

## 2022-12-31 DIAGNOSIS — Z7902 Long term (current) use of antithrombotics/antiplatelets: Secondary | ICD-10-CM

## 2022-12-31 DIAGNOSIS — K219 Gastro-esophageal reflux disease without esophagitis: Secondary | ICD-10-CM | POA: Diagnosis present

## 2022-12-31 DIAGNOSIS — W19XXXA Unspecified fall, initial encounter: Secondary | ICD-10-CM

## 2022-12-31 DIAGNOSIS — Z87891 Personal history of nicotine dependence: Secondary | ICD-10-CM

## 2022-12-31 DIAGNOSIS — I130 Hypertensive heart and chronic kidney disease with heart failure and stage 1 through stage 4 chronic kidney disease, or unspecified chronic kidney disease: Secondary | ICD-10-CM | POA: Diagnosis present

## 2022-12-31 DIAGNOSIS — M62838 Other muscle spasm: Secondary | ICD-10-CM | POA: Diagnosis present

## 2022-12-31 DIAGNOSIS — Z9012 Acquired absence of left breast and nipple: Secondary | ICD-10-CM

## 2022-12-31 DIAGNOSIS — J45909 Unspecified asthma, uncomplicated: Secondary | ICD-10-CM | POA: Diagnosis present

## 2022-12-31 DIAGNOSIS — Z79899 Other long term (current) drug therapy: Secondary | ICD-10-CM

## 2022-12-31 DIAGNOSIS — M8580 Other specified disorders of bone density and structure, unspecified site: Secondary | ICD-10-CM | POA: Diagnosis present

## 2022-12-31 DIAGNOSIS — Z853 Personal history of malignant neoplasm of breast: Secondary | ICD-10-CM

## 2022-12-31 DIAGNOSIS — S32509A Unspecified fracture of unspecified pubis, initial encounter for closed fracture: Secondary | ICD-10-CM | POA: Diagnosis present

## 2022-12-31 HISTORY — DX: Chronic kidney disease, stage 3a: N18.31

## 2022-12-31 LAB — BASIC METABOLIC PANEL
Anion gap: 9 (ref 5–15)
BUN: 12 mg/dL (ref 8–23)
CO2: 27 mmol/L (ref 22–32)
Calcium: 9 mg/dL (ref 8.9–10.3)
Chloride: 104 mmol/L (ref 98–111)
Creatinine, Ser: 0.99 mg/dL (ref 0.44–1.00)
GFR, Estimated: 54 mL/min — ABNORMAL LOW (ref >60)
Glucose, Bld: 120 mg/dL — ABNORMAL HIGH (ref 70–99)
Potassium: 2.8 mmol/L — ABNORMAL LOW (ref 3.5–5.1)
Sodium: 140 mmol/L (ref 135–145)

## 2022-12-31 LAB — CBC
HCT: 38 % (ref 36.0–46.0)
Hemoglobin: 12.4 g/dL (ref 12.0–15.0)
MCH: 30.5 pg (ref 26.0–34.0)
MCHC: 32.6 g/dL (ref 30.0–36.0)
MCV: 93.6 fL (ref 80.0–100.0)
Platelets: 163 10*3/uL (ref 150–400)
RBC: 4.06 MIL/uL (ref 3.87–5.11)
RDW: 13.3 % (ref 11.5–15.5)
WBC: 11.1 10*3/uL — ABNORMAL HIGH (ref 4.0–10.5)
nRBC: 0 % (ref 0.0–0.2)

## 2022-12-31 LAB — APTT: aPTT: 59 seconds — ABNORMAL HIGH (ref 24–36)

## 2022-12-31 LAB — PROTIME-INR
INR: 1.6 — ABNORMAL HIGH (ref 0.8–1.2)
Prothrombin Time: 19.6 seconds — ABNORMAL HIGH (ref 11.4–15.2)

## 2022-12-31 MED ORDER — ACETAMINOPHEN 325 MG PO TABS
650.0000 mg | ORAL_TABLET | Freq: Four times a day (QID) | ORAL | Status: DC | PRN
  Administered 2023-01-01: 650 mg via ORAL
  Filled 2022-12-31: qty 2

## 2022-12-31 MED ORDER — METHOCARBAMOL 500 MG PO TABS: 500.0000 mg | ORAL_TABLET | Freq: Once | ORAL | Status: DC

## 2022-12-31 MED ORDER — DM-GUAIFENESIN ER 30-600 MG PO TB12: 1.0000 | ORAL_TABLET | Freq: Two times a day (BID) | ORAL | Status: DC | PRN

## 2022-12-31 MED ORDER — LIDOCAINE 5 % EX PTCH
1.0000 | MEDICATED_PATCH | CUTANEOUS | Status: DC
  Administered 2022-12-31 – 2023-01-02 (×3): 1 via TRANSDERMAL
  Filled 2022-12-31 (×3): qty 1

## 2022-12-31 MED ORDER — FUROSEMIDE 20 MG PO TABS
20.0000 mg | ORAL_TABLET | Freq: Every day | ORAL | Status: DC
  Administered 2023-01-01 – 2023-01-03 (×3): 20 mg via ORAL
  Filled 2022-12-31 (×3): qty 1

## 2022-12-31 MED ORDER — ONDANSETRON HCL 4 MG/2ML IJ SOLN: 4.0000 mg | Freq: Three times a day (TID) | INTRAMUSCULAR | Status: DC | PRN

## 2022-12-31 MED ORDER — LORATADINE 10 MG PO TABS
10.0000 mg | ORAL_TABLET | Freq: Every day | ORAL | Status: DC
  Administered 2023-01-01 – 2023-01-03 (×3): 10 mg via ORAL
  Filled 2022-12-31 (×3): qty 1

## 2022-12-31 MED ORDER — LISINOPRIL 10 MG PO TABS
40.0000 mg | ORAL_TABLET | Freq: Every day | ORAL | Status: DC
  Administered 2023-01-01 – 2023-01-03 (×3): 40 mg via ORAL
  Filled 2022-12-31 (×3): qty 4

## 2022-12-31 MED ORDER — METHOCARBAMOL 500 MG PO TABS
500.0000 mg | ORAL_TABLET | Freq: Three times a day (TID) | ORAL | Status: DC | PRN
  Administered 2023-01-01: 500 mg via ORAL
  Filled 2022-12-31: qty 1

## 2022-12-31 MED ORDER — DABIGATRAN ETEXILATE MESYLATE 150 MG PO CAPS
150.0000 mg | ORAL_CAPSULE | Freq: Two times a day (BID) | ORAL | Status: DC
  Administered 2022-12-31 – 2023-01-03 (×6): 150 mg via ORAL
  Filled 2022-12-31 (×6): qty 1

## 2022-12-31 MED ORDER — ALBUTEROL SULFATE (2.5 MG/3ML) 0.083% IN NEBU: 2.5000 mg | INHALATION_SOLUTION | RESPIRATORY_TRACT | Status: DC | PRN

## 2022-12-31 MED ORDER — SENNA 8.6 MG PO TABS: 1.0000 | ORAL_TABLET | Freq: Every day | ORAL | Status: DC | PRN

## 2022-12-31 MED ORDER — ATENOLOL 25 MG PO TABS
50.0000 mg | ORAL_TABLET | Freq: Two times a day (BID) | ORAL | Status: DC
  Administered 2022-12-31 – 2023-01-03 (×6): 50 mg via ORAL
  Filled 2022-12-31 (×6): qty 2

## 2022-12-31 MED ORDER — ACETAMINOPHEN 500 MG PO TABS
1000.0000 mg | ORAL_TABLET | Freq: Once | ORAL | Status: AC
  Administered 2022-12-31: 1000 mg via ORAL
  Filled 2022-12-31: qty 2

## 2022-12-31 MED ORDER — HYDRALAZINE HCL 20 MG/ML IJ SOLN: 5.0000 mg | INTRAMUSCULAR | Status: DC | PRN

## 2022-12-31 MED ORDER — MONTELUKAST SODIUM 10 MG PO TABS
10.0000 mg | ORAL_TABLET | Freq: Every day | ORAL | Status: DC
  Administered 2022-12-31 – 2023-01-02 (×3): 10 mg via ORAL
  Filled 2022-12-31 (×3): qty 1

## 2022-12-31 MED ORDER — NIFEDIPINE ER 60 MG PO TB24
60.0000 mg | ORAL_TABLET | Freq: Every day | ORAL | Status: DC
  Administered 2023-01-01 – 2023-01-03 (×3): 60 mg via ORAL
  Filled 2022-12-31 (×3): qty 1

## 2022-12-31 MED ORDER — ROSUVASTATIN CALCIUM 10 MG PO TABS
10.0000 mg | ORAL_TABLET | Freq: Every day | ORAL | Status: DC
  Administered 2022-12-31 – 2023-01-02 (×3): 10 mg via ORAL
  Filled 2022-12-31 (×3): qty 1

## 2022-12-31 NOTE — ED Triage Notes (Addendum)
Pt BIB [facility] from Home for a mechanical fall, pt tripped on feet going up step to house, pt has small hematoma to back of head as well as pain in right hip. No shortening or rotation noted. Pt denies LOC, SOB, C, vision changes.. Pt currently not weightbearing on the right side. Pt is on blood thinners, Prodaxine, has multiple stents Also recently increased BP meds for HTN on Friday.

## 2022-12-31 NOTE — H&P (Incomplete)
History and Physical    Colleen [name]:[id number] DOB: [birth year] DOA: 12/31/2022  Referring MD/NP/PA:   PCP: Mick Sell, MD   Patient coming from:  The patient is coming from home.     Chief Complaint: fall and right hip pain  HPI: Colleen [name] is a 89 y.o. female with medical history significant of HTN, HLD, ashtma, CAD, dCHF, stroke, CKD-3a, A fib on Pradaxa, breast cancer (s/p left mastectomy), who presents with fall and right hip pain.  Pt states that she fell accidentally when she was coming down off the stairs at home. She states that when she turned to the left, she twisted her legs and fell down on her right side.  She does state hitting her head but denies loss of consciousness.  Has noted bump on the right side of her head.  She injured her hip, causing pain in the right hip.  Her right hip is constant, sharp, 4 out of 10 in severity at rest, 10 out of 10 in severity with movement, nonradiating, aggravated by movement.  Patient denies chest pain, cough or SOB.  No nausea, vomiting, diarrhea or abdominal pain.  No symptoms of UTI.  She took her Pradaxa this morning.  Data reviewed independently and ED Course: pt was found to have WBC 11.1, potassium 2.8, stable renal function. Temperature normal, blood pressure 178/75, 160/76, heart rate 92, 71, RR 20, oxygen saturation 93% on room air.  CT of head negative for acute intracranial abnormalities.  CT of C-spine negative for acute injury, but showed degenerative disc disease.  Patient is placed on TeleMed  bed for obs. Consulted Dr. Rosann Auerbach of ortho.  X-ray of right hip/pelvis: Nondisplaced right-sided pubic bone fracture near the symphysis and inferior pubic ramus   Osteopenia with chronic changes   EKG: I have personally reviewed.  Atrial fibrillation, QTc 341, RAD, low voltage, poor R wave progression   Review of Systems:   General: no fevers, chills, no body weight gain, has fatigue HEENT: no blurry vision,  hearing changes or sore throat Respiratory: no dyspnea, coughing, wheezing CV: no chest pain, no palpitations GI: no nausea, vomiting, abdominal pain, diarrhea, constipation GU: no dysuria, burning on urination, increased urinary frequency, hematuria  Ext: no leg edema Neuro: no unilateral weakness, numbness, or tingling, no vision change or hearing loss. Has fall Skin: no rash, no skin tear. MSK: has right hip pain Heme: No easy bruising.  Travel history: No recent long distant travel.   Allergy:  Allergies  Allergen Reactions   Ibuprofen Swelling   Codeine Anxiety    Hallucination.    Past Medical History:  Diagnosis Date   Aortic valve disorder    Arthritis    Asthma    Atrial fibrillation (HCC)    Breast cancer (HCC) 2004   LT MASTECTOMY   Chronic kidney disease, stage 3a (HCC)    Fibrocystic disease of both breasts    GERD (gastroesophageal reflux disease)    Hx of colonic polyps    Hyperlipidemia    Hypertension    Malignant neoplasm of breast (HCC)    left   Osteoarthritis of right knee 2019   Stroke [facility]) 2008   right peripheral vision is gone    Past Surgical History:  Procedure Laterality Date   ABDOMINAL HYSTERECTOMY  1968   APPENDECTOMY     BREAST EXCISIONAL BIOPSY Right 1986   NEG   CATARACT EXTRACTION W/ INTRAOCULAR LENS  IMPLANT, BILATERAL  COLONOSCOPY  2000, 2003, 2008, 2013   deviated septum repair  1973   ESOPHAGOGASTRODUODENOSCOPY  2013   EYE SURGERY     MASTECTOMY Left 2004   BREAST CA   TONSILLECTOMY     TOTAL KNEE ARTHROPLASTY Right 11/13/2017   Procedure: TOTAL KNEE ARTHROPLASTY;  Surgeon: Christena Flake, MD;  Location: [facility] ORS;  Service: Orthopedics;  Laterality: Right;    Social History:  reports that she quit smoking about 41 years ago. Her smoking use included cigarettes. She started smoking about 72 years ago. She has a 93 pack-year smoking history. She has never used smokeless tobacco. She reports current alcohol use. She  reports that she does not use drugs.  Family History:  Family History  Problem Relation Age of Onset   Breast cancer Cousin    Breast cancer Other    Diabetes Mother    Heart disease Mother    Heart attack Mother    Heart disease Father    Heart attack Father    Heart disease Sister    Aortic aneurysm Sister    Heart attack Sister    Heart disease Brother    Stroke Paternal Grandmother    Hypertension Son    Rheum arthritis Son      Prior to Admission medications   Medication Sig Start Date End Date Taking? Authorizing Provider  acetaminophen (TYLENOL) 325 MG tablet Take 650 mg by mouth every 4 (four) hours as needed. for pain/ increased temp. May be administered orally, per G-tube if needed or rectally if unable to swallow (separate order). Maximum dose for 24 hours is 3,000 mg from all sources of Acetaminophen/ Tylenol    [provider]  atenolol (TENORMIN) 50 MG tablet Take 50 mg by mouth 2 (two) times daily.  08/30/14   [provider]  b complex vitamins tablet Take 1 tablet by mouth daily.    [provider]  Biotin 1 MG CAPS Take 1 capsule by mouth daily.    [provider]  Calcium Carbonate-Vitamin D3 600-400 MG-UNIT TABS Take 1 tablet by mouth daily.    [provider]  cetirizine (ZYRTEC) 10 MG tablet Take 10 mg by mouth daily.     [provider]  cholecalciferol (VITAMIN D-400) 400 units TABS tablet Take 400 Units by mouth daily.    [provider]  COVID-19 mRNA Vac-TriS, Pfizer, (PFIZER-BIONT COVID-19 VAC-TRIS) SUSP injection Inject into the muscle. 08/17/20   Judyann Munson, MD  fluticasone (FLONASE) 50 MCG/ACT nasal spray Place 2 sprays into both nostrils daily.     [provider]  folic acid (FOLVITE) 400 MCG tablet Take 400 mcg by mouth daily.    [provider]  furosemide (LASIX) 20 MG tablet Take 20 mg by mouth daily.  08/15/14   [provider]  Glucosamine-Chondroitin  250-200 MG TABS Take 1 tablet by mouth 2 (two) times daily.    [provider]  GLUCOSAMINE-FISH OIL-EPA-DHA PO Take by mouth. Take 1 capsule  (1,200-144-216 mg) by mouth daily for supplement    [provider]  montelukast (SINGULAIR) 10 MG tablet Take 10 mg by mouth at bedtime.     [provider]  Multiple Vitamin (MULTIVITAMIN WITH MINERALS) TABS tablet Take 1 tablet by mouth daily.    [provider]  PRADAXA 150 MG CAPS capsule Take 150 mg by mouth 2 (two) times daily.  10/06/14   [provider]  rosuvastatin (CRESTOR) 10 MG tablet Take 10 mg  by mouth at bedtime.     [provider]  senna (SENOKOT) 8.6 MG TABS tablet Take 1 tablet by mouth daily.    [provider]  spironolactone (ALDACTONE) 25 MG tablet Take 25 mg by mouth daily.     [provider]  traMADol (ULTRAM) 50 MG tablet Take one tablet by mouth three times daily as needed for mod-severe pain 11/26/17   Reed, Tiffany L, DO  UNABLE TO FIND Diet Type: NAS    [provider]  vitamin C (ASCORBIC ACID) 500 MG tablet Take 1,000 mg by mouth daily.     [provider]  vitamin E 400 UNIT capsule Take 400 Units by mouth daily.    [provider]    Physical Exam: Vitals:   12/31/22 2000 12/31/22 2045 12/31/22 2100 12/31/22 2313  BP:  (!) 160/76 (!) 153/93 (!) 149/81  Pulse: 83 71 91 100  Resp: 20   (!) 22  Temp:    99 F (37.2 C)  SpO2:    92%  Weight:      Height:       General: Not in acute distress HEENT: has a small hematoma on the right side of her head       Eyes: PERRL, EOMI, no jaundice       ENT: No discharge from the ears and nose, no pharynx injection, no tonsillar enlargement.        Neck: No JVD, no bruit, no mass felt. Heme: No neck lymph node enlargement. Cardiac: S1/S2, RRR, No murmurs, No gallops or rubs. Respiratory: No rales, wheezing, rhonchi or rubs. GI: Soft, nondistended, nontender, no rebound pain, no  organomegaly, BS present. GU: No hematuria Ext: No pitting leg edema bilaterally. 1+DP/PT pulse bilaterally. Musculoskeletal: has right hip tenderness Skin: No rashes.  Neuro: Alert, oriented X3, cranial nerves II-XII grossly intact, moves all extremities normally.  Psych: Patient is not psychotic, no suicidal or hemocidal ideation.  Labs on Admission: I have personally reviewed following labs and imaging studies  CBC: Recent Labs  Lab 12/31/22 2317  WBC 11.1*  HGB 12.4  HCT 38.0  MCV 93.6  PLT 163   Basic Metabolic Panel: Recent Labs  Lab 12/31/22 2317  NA 140  K 2.8*  CL 104  CO2 27  GLUCOSE 120*  BUN 12  CREATININE 0.99  CALCIUM 9.0   GFR: Estimated Creatinine Clearance: 32.2 mL/min (by C-G formula based on SCr of 0.99 mg/dL). Liver Function Tests: No results for input(s): "AST", "ALT", "ALKPHOS", "BILITOT", "PROT", "ALBUMIN" in the last 168 hours. No results for input(s): "LIPASE", "AMYLASE" in the last 168 hours. No results for input(s): "AMMONIA" in the last 168 hours. Coagulation Profile: Recent Labs  Lab 12/31/22 2317  INR 1.6*   Cardiac Enzymes: No results for input(s): "CKTOTAL", "CKMB", "CKMBINDEX", "TROPONINI" in the last 168 hours. BNP (last 3 results) No results for input(s): "PROBNP" in the last 8760 hours. HbA1C: No results for input(s): "HGBA1C" in the last 72 hours. CBG: No results for input(s): "GLUCAP" in the last 168 hours. Lipid Profile: No results for input(s): "CHOL", "HDL", "LDLCALC", "TRIG", "CHOLHDL", "LDLDIRECT" in the last 72 hours. Thyroid Function Tests: No results for input(s): "TSH", "T4TOTAL", "FREET4", "T3FREE", "THYROIDAB" in the last 72 hours. Anemia Panel: No results for input(s): "VITAMINB12", "FOLATE", "FERRITIN", "TIBC", "IRON", "RETICCTPCT" in the last 72 hours. Urine analysis:    Component Value Date/Time   COLORURINE STRAW (A) 11/16/2017 2248   APPEARANCEUR CLEAR (A) 11/16/2017 2248  LABSPEC 1.008 11/16/2017  2248   PHURINE 6.0 11/16/2017 2248   GLUCOSEU NEGATIVE 11/16/2017 2248   HGBUR NEGATIVE 11/16/2017 2248   BILIRUBINUR NEGATIVE 11/16/2017 2248   KETONESUR NEGATIVE 11/16/2017 2248   PROTEINUR NEGATIVE 11/16/2017 2248   NITRITE NEGATIVE 11/16/2017 2248   LEUKOCYTESUR NEGATIVE 11/16/2017 2248   Sepsis Labs: @LABRCNTIP (procalcitonin:4,lacticidven:4) )No results found for this or any previous visit (from the past 240 hour(s)).   Radiological Exams on Admission: CT Head Wo Contrast  Result Date: 12/31/2022 CLINICAL DATA:  Recent fall with headaches and neck pain, initial encounter EXAM: CT HEAD WITHOUT CONTRAST CT CERVICAL SPINE WITHOUT CONTRAST TECHNIQUE: Multidetector CT imaging of the head and cervical spine was performed following the standard protocol without intravenous contrast. Multiplanar CT image reconstructions of the cervical spine were also generated. RADIATION DOSE REDUCTION: This exam was performed according to the departmental dose-optimization program which includes automated exposure control, adjustment of the mA and/or kV according to patient size and/or use of iterative reconstruction technique. COMPARISON:  07/22/2007 FINDINGS: CT HEAD FINDINGS Brain: No evidence of acute infarction, hemorrhage, hydrocephalus, extra-axial collection or mass lesion/mass effect. Chronic white matter ischemic changes are noted. Diffuse basal ganglia calcifications are seen. Vascular: No hyperdense vessel or unexpected calcification. Skull: Normal. Negative for fracture or focal lesion. Sinuses/Orbits: Orbits and their contents are within normal limits. Chronic opacification in the left maxillary antrum is noted. Other: Scalp swelling is noted posteriorly in the right parietal region consistent with the recent injury. CT CERVICAL SPINE FINDINGS Alignment: Within normal limits. Skull base and vertebrae: 7 cervical segments are well visualized. Vertebral body height is well maintained. Multilevel  osteophytic changes are seen. Facet hypertrophic changes are noted with minimal anterolisthesis of C4 on C5. No acute fracture or acute facet abnormality is noted. Soft tissues and spinal canal: Surrounding soft tissue structures are within normal limits. Upper chest: Lung apices demonstrate some pleural calcifications bilaterally. Other: None IMPRESSION: CT of the head: Chronic atrophic and ischemic changes. Scalp swelling on the right consistent the recent injury. CT of the cervical spine: No acute bony abnormality is noted. Multilevel degenerative changes are seen. Electronically Signed   By: Alcide Clever M.D.   On: 12/31/2022 19:05   CT Cervical Spine Wo Contrast  Result Date: 12/31/2022 CLINICAL DATA:  Recent fall with headaches and neck pain, initial encounter EXAM: CT HEAD WITHOUT CONTRAST CT CERVICAL SPINE WITHOUT CONTRAST TECHNIQUE: Multidetector CT imaging of the head and cervical spine was performed following the standard protocol without intravenous contrast. Multiplanar CT image reconstructions of the cervical spine were also generated. RADIATION DOSE REDUCTION: This exam was performed according to the departmental dose-optimization program which includes automated exposure control, adjustment of the mA and/or kV according to patient size and/or use of iterative reconstruction technique. COMPARISON:  07/22/2007 FINDINGS: CT HEAD FINDINGS Brain: No evidence of acute infarction, hemorrhage, hydrocephalus, extra-axial collection or mass lesion/mass effect. Chronic white matter ischemic changes are noted. Diffuse basal ganglia calcifications are seen. Vascular: No hyperdense vessel or unexpected calcification. Skull: Normal. Negative for fracture or focal lesion. Sinuses/Orbits: Orbits and their contents are within normal limits. Chronic opacification in the left maxillary antrum is noted. Other: Scalp swelling is noted posteriorly in the right parietal region consistent with the recent injury. CT  CERVICAL SPINE FINDINGS Alignment: Within normal limits. Skull base and vertebrae: 7 cervical segments are well visualized. Vertebral body height is well maintained. Multilevel osteophytic changes are seen. Facet hypertrophic changes are noted with minimal anterolisthesis of C4 on  C5. No acute fracture or acute facet abnormality is noted. Soft tissues and spinal canal: Surrounding soft tissue structures are within normal limits. Upper chest: Lung apices demonstrate some pleural calcifications bilaterally. Other: None IMPRESSION: CT of the head: Chronic atrophic and ischemic changes. Scalp swelling on the right consistent the recent injury. CT of the cervical spine: No acute bony abnormality is noted. Multilevel degenerative changes are seen. Electronically Signed   By: Alcide Clever M.D.   On: 12/31/2022 19:05   DG Hip Unilat W or Wo Pelvis 2-3 Views Right  Result Date: 12/31/2022 CLINICAL DATA:  Ground level fall.  Pain EXAM: DG HIP (WITH OR WITHOUT PELVIS) 3V RIGHT COMPARISON:  None Available. FINDINGS: Osteopenia. Hyperostosis. Mild degenerative changes seen of the sacroiliac joints and hip joints. There is a subtle fracture suggested along the right side of the pubic symphysis extending into the inferior pubic ramus. No additional obvious fracture although with this level of osteopenia subtle injury is difficult to completely exclude and if needed additional workup with CT as clinically directed. IMPRESSION: Nondisplaced right-sided pubic bone fracture near the symphysis and inferior pubic ramus Osteopenia with chronic changes Electronically Signed   By: Karen Kays M.D.   On: 12/31/2022 18:36      Assessment/Plan Principal Problem:   Pubic bone fracture (HCC) Active Problems:   Fall at home, initial encounter   Chronic diastolic CHF (congestive heart failure) (HCC)   CAD (coronary artery disease)   Hypertension   Hyperlipidemia   Atrial fibrillation, chronic (HCC)   Stroke (HCC)   Asthma    Chronic kidney disease, stage 3a (HCC)   Hypokalemia   Assessment and Plan:  Pubic bone fracture and fall at home (initial encounter): X-ray showed nondisplaced right-sided pubic bone fracture near the symphysis and inferior pubic ramus.  Most likely not need surgery.  Consulted Dr. Angelina Ok of Ortho.  -Placed on telemetry bed for observation -As needed Tylenol for pain -Lidoderm patch -As needed Robaxin for muscle spasm -Patient refused narcotic pain meds. -PT/OT -Consult TOC for home health need  Chronic diastolic CHF (congestive heart failure) (HCC): 2D echo on 11/18/2017 showed EF of 60-65%.  Patient does not have leg edema or JVD.  CHF is compensated. -Check BNP -Continue home Lasix  CAD (coronary artery disease): S/p of stent placement.  No chest pain -Crestor  Hypertension -IV hydralazine as needed -Atenolol, Lasix, lisinopril, nifedipine  Hyperlipidemia -Crestor  Atrial fibrillation, chronic (HCC): currently heart rate 92 --70s>  -Continue atenolol -Continue Pradaxa  Stroke (HCC) -Crestor and Pradaxa  Asthma: Stable -Singulair -Bronchodilators as needed  Chronic kidney disease, stage 3a (HCC): Recent creatinine 1.0 on 10/23/2022. -Pending BMP  Hypokalemia: Potassium 2.8 -Repleted potassium -Check magnesium and phosphorus level       DVT ppx: On Pradaxa  Code Status: Full code per pt  Family Communication:  Yes, patient's neighbor friend   at bed side.      Disposition Plan:  Anticipate discharge back to previous environment  Consults called: Dr. Rosann Auerbach of ortho  Admission status and Level of care: Telemetry Medical:    for obs    Dispo: The patient is from: Home              Anticipated d/c is to: to be determined              Anticipated d/c date is: 1 day              Patient currently is not medically stable  to d/c.    Severity of Illness:  The appropriate patient status for this patient is OBSERVATION. Observation status is judged  to be reasonable and necessary in order to provide the required intensity of service to ensure the patient's safety. The patient's presenting symptoms, physical exam findings, and initial radiographic and laboratory data in the context of their medical condition is felt to place them at decreased risk for further clinical deterioration. Furthermore, it is anticipated that the patient will be medically stable for discharge from the hospital within 2 midnights of admission.        Date of Service 01/01/2023    Lorretta Harp [facility]   If 7PM-7AM, please contact night-coverage [email or web address] 01/01/2023, 1:44 AM

## 2022-12-31 NOTE — ED Provider Notes (Signed)
[facility] Provider Note    Event Date/Time   First MD Initiated Contact with Patient 12/31/22 1731     (approximate)   History   Fall   HPI Colleen [name] is a 89 y.o. female on Pradaxa who presents today for ground-level fall.  Patient was coming down off the stairs as she turned to the left twisted her legs underneath her and fell down on her right side.  She does state hitting her head but denies loss of consciousness.  Has noted bump on the right side of her head.  Denies any vision changes or neck trauma or neck pain.  Also notes right-sided hip pain that is more the posterior side.  She has not walked since the injury happened.  She denies pain elsewhere to the chest wall, back, or other extremities.     Physical Exam   Triage Vital Signs: ED Triage Vitals [12/31/22 1734]  Encounter Vitals Group     BP      Systolic BP Percentile      Diastolic BP Percentile      Pulse      Resp      Temp      Temp src      SpO2      Weight 147 lb (66.7 kg)     Height 5' (1.524 m)     Head Circumference      Peak Flow      Pain Score 4     Pain Loc      Pain Education      Exclude from Growth Chart     Most recent vital signs: Vitals:   12/31/22 2100 12/31/22 2313  BP: (!) 153/93 (!) 149/81  Pulse: 91 100  Resp:  (!) 22  Temp:  99 F (37.2 C)  SpO2:  92%    Physical Exam: I have reviewed the vital signs and nursing notes. General: Awake, alert, no acute distress.  Nontoxic appearing. Head: Hematoma without laceration to the right parietal region, normocephalic.   ENT:  EOM intact, PERRL. Oral mucosa is pink and moist with no lesions. Neck: Neck is supple with full range of motion, No meningeal signs. Cardiovascular:  RRR, No murmurs. Peripheral pulses palpable and equal bilaterally. Respiratory:  Symmetrical chest wall expansion.  No rhonchi, rales, or wheezes.  Good air movement throughout.  No use of accessory muscles.   Musculoskeletal:   No cyanosis or edema. Moving extremities with full ROM.  Slight tenderness palpation to the posterior lateral right hip.  No obvious deformity.  Negative logroll to bilateral lower extremities.  No other tenderness palpation throughout other extremities outside the right hip.  No C, T, or L-spine tenderness to palpation. Abdomen:  Soft, nontender, nondistended. Neuro:  GCS 15, moving all four extremities, interacting appropriately. Speech clear. Psych:  Calm, appropriate.   Skin:  Warm, dry, no rash.     ED Results / Procedures / Treatments   Labs (all labs ordered are listed, but only abnormal results are displayed) Labs Reviewed  CBC - Abnormal; Notable for the following components:      Result Value   WBC 11.1 (*)    All other components within normal limits  BASIC METABOLIC PANEL - Abnormal; Notable for the following components:   Potassium 2.8 (*)    Glucose, Bld 120 (*)    GFR, Estimated 54 (*)    All other components within normal limits  BRAIN NATRIURETIC PEPTIDE -  Abnormal; Notable for the following components:   B Natriuretic Peptide 350.2 (*)    All other components within normal limits  PROTIME-INR - Abnormal; Notable for the following components:   Prothrombin Time 19.6 (*)    INR 1.6 (*)    All other components within normal limits  APTT - Abnormal; Notable for the following components:   aPTT 59 (*)    All other components within normal limits  BASIC METABOLIC PANEL  CBC     EKG    RADIOLOGY CT head and C-spine independently interpreted by me without acute pathology.  Pelvic x-ray shows nondisplaced inferior pubic rami fracture.   PROCEDURES:  Critical Care performed: No  Procedures   MEDICATIONS ORDERED IN ED: Medications  lidocaine (LIDODERM) 5 % 1 patch (1 patch Transdermal Patch Applied 12/31/22 2321)  acetaminophen (TYLENOL) tablet 650 mg (has no administration in time range)  methocarbamol (ROBAXIN) tablet 500 mg (has no administration in  time range)  ondansetron (ZOFRAN) injection 4 mg (has no administration in time range)  hydrALAZINE (APRESOLINE) injection 5 mg (has no administration in time range)  albuterol (PROVENTIL) (2.5 MG/3ML) 0.083% nebulizer solution 2.5 mg (has no administration in time range)  dextromethorphan-guaiFENesin (MUCINEX DM) 30-600 MG per 12 hr tablet 1 tablet (has no administration in time range)  atenolol (TENORMIN) tablet 50 mg (50 mg Oral Given 12/31/22 2320)  furosemide (LASIX) tablet 20 mg (has no administration in time range)  rosuvastatin (CRESTOR) tablet 10 mg (10 mg Oral Given 12/31/22 2320)  dabigatran (PRADAXA) capsule 150 mg (150 mg Oral Given 12/31/22 2320)  montelukast (SINGULAIR) tablet 10 mg (10 mg Oral Given 12/31/22 2320)  senna (SENOKOT) tablet 8.6 mg (has no administration in time range)  loratadine (CLARITIN) tablet 10 mg (has no administration in time range)  lisinopril (ZESTRIL) tablet 40 mg (has no administration in time range)  NIFEdipine (ADALAT CC) 24 hr tablet 60 mg (has no administration in time range)  acetaminophen (TYLENOL) tablet 1,000 mg (1,000 mg Oral Given 12/31/22 1922)     IMPRESSION / MDM / ASSESSMENT AND PLAN / ED COURSE  I reviewed the triage vital signs and the nursing notes.                              Differential diagnosis includes, but is not limited to, ICH, SAH, SDH, epidural hematoma, cervical spine injury, soft tissue hematoma of the scalp, femur fracture, pelvis fracture.  Patient's presentation is most consistent with acute presentation with potential threat to life or bodily function.  Patient is a 89 year old female presenting today for ground-level fall after tripping on a stair.  CT imaging of head and C-spine without acute pathology.  Right hip x-ray shows nondisplaced inferior pubic rami fracture.  This is a nonsurgical injury that she can bear weight on.  Attempted to bear weight and patient unable to tolerate pain symptoms.  Will admit to  hospitalist for PT/OT and ongoing pain management before potential home versus placement.  Hospitalist agreed to admit patient for further care.  The patient is on the cardiac monitor to evaluate for evidence of arrhythmia and/or significant heart rate changes. Clinical Course as of 01/01/23 0006  Wynelle Link Dec 31, 2022  1910 CT Head Wo Contrast No acute intracranial abnormalities of the head or cervical spine per my interpretation [DW]  1910 DG Hip Unilat W or Wo Pelvis 2-3 Views Right Nondisplaced inferior pubic rami fracture. [DW]  Clinical Course User Index [DW] Janith Lima, MD     FINAL CLINICAL IMPRESSION(S) / ED DIAGNOSES   Final diagnoses:  Closed fracture of right inferior pubic ramus, initial encounter [facility])  Fall, initial encounter     Rx / DC Orders   ED Discharge Orders     None        Note:  This document was prepared using Dragon voice recognition software and may include unintentional dictation errors.   Janith Lima, MD 01/01/23 [phone]

## 2023-01-01 DIAGNOSIS — S329XXA Fracture of unspecified parts of lumbosacral spine and pelvis, initial encounter for closed fracture: Secondary | ICD-10-CM | POA: Diagnosis present

## 2023-01-01 DIAGNOSIS — N1831 Chronic kidney disease, stage 3a: Secondary | ICD-10-CM | POA: Diagnosis present

## 2023-01-01 DIAGNOSIS — M62838 Other muscle spasm: Secondary | ICD-10-CM | POA: Diagnosis present

## 2023-01-01 DIAGNOSIS — I2510 Atherosclerotic heart disease of native coronary artery without angina pectoris: Secondary | ICD-10-CM | POA: Diagnosis present

## 2023-01-01 DIAGNOSIS — W1830XA Fall on same level, unspecified, initial encounter: Secondary | ICD-10-CM | POA: Diagnosis present

## 2023-01-01 DIAGNOSIS — Z87891 Personal history of nicotine dependence: Secondary | ICD-10-CM | POA: Diagnosis not present

## 2023-01-01 DIAGNOSIS — Z853 Personal history of malignant neoplasm of breast: Secondary | ICD-10-CM | POA: Diagnosis not present

## 2023-01-01 DIAGNOSIS — Z9841 Cataract extraction status, right eye: Secondary | ICD-10-CM | POA: Diagnosis not present

## 2023-01-01 DIAGNOSIS — Z885 Allergy status to narcotic agent status: Secondary | ICD-10-CM | POA: Diagnosis not present

## 2023-01-01 DIAGNOSIS — W19XXXA Unspecified fall, initial encounter: Secondary | ICD-10-CM | POA: Diagnosis not present

## 2023-01-01 DIAGNOSIS — Z79899 Other long term (current) drug therapy: Secondary | ICD-10-CM | POA: Diagnosis not present

## 2023-01-01 DIAGNOSIS — Z96651 Presence of right artificial knee joint: Secondary | ICD-10-CM | POA: Diagnosis present

## 2023-01-01 DIAGNOSIS — Y92009 Unspecified place in unspecified non-institutional (private) residence as the place of occurrence of the external cause: Secondary | ICD-10-CM | POA: Diagnosis not present

## 2023-01-01 DIAGNOSIS — Z9842 Cataract extraction status, left eye: Secondary | ICD-10-CM | POA: Diagnosis not present

## 2023-01-01 DIAGNOSIS — S32509A Unspecified fracture of unspecified pubis, initial encounter for closed fracture: Secondary | ICD-10-CM | POA: Diagnosis not present

## 2023-01-01 DIAGNOSIS — Z8249 Family history of ischemic heart disease and other diseases of the circulatory system: Secondary | ICD-10-CM | POA: Diagnosis not present

## 2023-01-01 DIAGNOSIS — Z833 Family history of diabetes mellitus: Secondary | ICD-10-CM | POA: Diagnosis not present

## 2023-01-01 DIAGNOSIS — I10 Essential (primary) hypertension: Secondary | ICD-10-CM | POA: Diagnosis not present

## 2023-01-01 DIAGNOSIS — I5032 Chronic diastolic (congestive) heart failure: Secondary | ICD-10-CM | POA: Diagnosis present

## 2023-01-01 DIAGNOSIS — S32591A Other specified fracture of right pubis, initial encounter for closed fracture: Secondary | ICD-10-CM | POA: Diagnosis present

## 2023-01-01 DIAGNOSIS — Z8673 Personal history of transient ischemic attack (TIA), and cerebral infarction without residual deficits: Secondary | ICD-10-CM | POA: Diagnosis not present

## 2023-01-01 DIAGNOSIS — E876 Hypokalemia: Secondary | ICD-10-CM

## 2023-01-01 DIAGNOSIS — Z961 Presence of intraocular lens: Secondary | ICD-10-CM | POA: Diagnosis present

## 2023-01-01 DIAGNOSIS — I482 Chronic atrial fibrillation, unspecified: Secondary | ICD-10-CM | POA: Diagnosis not present

## 2023-01-01 DIAGNOSIS — E785 Hyperlipidemia, unspecified: Secondary | ICD-10-CM | POA: Diagnosis present

## 2023-01-01 DIAGNOSIS — Z7902 Long term (current) use of antithrombotics/antiplatelets: Secondary | ICD-10-CM | POA: Diagnosis not present

## 2023-01-01 DIAGNOSIS — M8580 Other specified disorders of bone density and structure, unspecified site: Secondary | ICD-10-CM | POA: Diagnosis present

## 2023-01-01 DIAGNOSIS — Z823 Family history of stroke: Secondary | ICD-10-CM | POA: Diagnosis not present

## 2023-01-01 DIAGNOSIS — Z9012 Acquired absence of left breast and nipple: Secondary | ICD-10-CM | POA: Diagnosis not present

## 2023-01-01 DIAGNOSIS — I130 Hypertensive heart and chronic kidney disease with heart failure and stage 1 through stage 4 chronic kidney disease, or unspecified chronic kidney disease: Secondary | ICD-10-CM | POA: Diagnosis present

## 2023-01-01 DIAGNOSIS — K219 Gastro-esophageal reflux disease without esophagitis: Secondary | ICD-10-CM | POA: Diagnosis present

## 2023-01-01 LAB — BASIC METABOLIC PANEL
Anion gap: 8 (ref 5–15)
BUN: 11 mg/dL (ref 8–23)
CO2: 24 mmol/L (ref 22–32)
Calcium: 8.6 mg/dL — ABNORMAL LOW (ref 8.9–10.3)
Chloride: 108 mmol/L (ref 98–111)
Creatinine, Ser: 0.75 mg/dL (ref 0.44–1.00)
GFR, Estimated: >60 mL/min (ref >60)
Glucose, Bld: 120 mg/dL — ABNORMAL HIGH (ref 70–99)
Potassium: 3.2 mmol/L — ABNORMAL LOW (ref 3.5–5.1)
Sodium: 140 mmol/L (ref 135–145)

## 2023-01-01 LAB — CBC
HCT: 35 % — ABNORMAL LOW (ref 36.0–46.0)
Hemoglobin: 11.6 g/dL — ABNORMAL LOW (ref 12.0–15.0)
MCH: 31 pg (ref 26.0–34.0)
MCHC: 33.1 g/dL (ref 30.0–36.0)
MCV: 93.6 fL (ref 80.0–100.0)
Platelets: 157 10*3/uL (ref 150–400)
RBC: 3.74 MIL/uL — ABNORMAL LOW (ref 3.87–5.11)
RDW: 13.4 % (ref 11.5–15.5)
WBC: 8.6 10*3/uL (ref 4.0–10.5)
nRBC: 0 % (ref 0.0–0.2)

## 2023-01-01 LAB — MAGNESIUM: Magnesium: 2 mg/dL (ref 1.7–2.4)

## 2023-01-01 LAB — PHOSPHORUS: Phosphorus: 3.5 mg/dL (ref 2.5–4.6)

## 2023-01-01 LAB — BRAIN NATRIURETIC PEPTIDE: B Natriuretic Peptide: 350.2 pg/mL — ABNORMAL HIGH (ref 0.0–100.0)

## 2023-01-01 MED ORDER — ACETAMINOPHEN 500 MG PO TABS
1000.0000 mg | ORAL_TABLET | Freq: Three times a day (TID) | ORAL | Status: DC
  Administered 2023-01-01 – 2023-01-03 (×6): 1000 mg via ORAL
  Filled 2023-01-01 (×6): qty 2

## 2023-01-01 MED ORDER — POTASSIUM CHLORIDE 10 MEQ/100ML IV SOLN
10.0000 meq | INTRAVENOUS | Status: AC
  Administered 2023-01-01 (×2): 10 meq via INTRAVENOUS
  Filled 2023-01-01 (×3): qty 100

## 2023-01-01 MED ORDER — TRAMADOL HCL 50 MG PO TABS
50.0000 mg | ORAL_TABLET | Freq: Four times a day (QID) | ORAL | Status: DC | PRN
  Administered 2023-01-01 – 2023-01-02 (×2): 50 mg via ORAL
  Filled 2023-01-01 (×2): qty 1

## 2023-01-01 MED ORDER — POTASSIUM CHLORIDE CRYS ER 20 MEQ PO TBCR
20.0000 meq | EXTENDED_RELEASE_TABLET | Freq: Once | ORAL | Status: AC
  Administered 2023-01-01: 20 meq via ORAL
  Filled 2023-01-01: qty 1

## 2023-01-01 MED ORDER — HYDRALAZINE HCL 25 MG PO TABS: 25.0000 mg | ORAL_TABLET | Freq: Four times a day (QID) | ORAL | Status: DC | PRN

## 2023-01-01 MED ORDER — HYDRALAZINE HCL 20 MG/ML IJ SOLN: 5.0000 mg | INTRAMUSCULAR | Status: DC | PRN

## 2023-01-01 MED ORDER — HYALURONIDASE HUMAN 150 UNIT/ML IJ SOLN
150.0000 [IU] | Freq: Once | INTRAMUSCULAR | Status: AC
  Administered 2023-01-01: 150 [IU] via SUBCUTANEOUS
  Filled 2023-01-01: qty 1

## 2023-01-01 NOTE — Plan of Care (Signed)

## 2023-01-01 NOTE — Evaluation (Signed)
Physical Therapy Evaluation Patient Details Name: Colleen [name] MRN: [id number] DOB: Aug 14, 1930 Today's Date: 01/01/2023  History of Present Illness  Colleen [name] is a 89 y/o F with PMH including L mastectomy and breast cancer, HTN, storke, CKD3, a-fib. Admitted after falling on stairs at home; has pubic fx.   Clinical Impression  Pt received in Semi-Fowler's position and agreeable to therapy.  PT and OT co-treat utilized for patient safety, and coordinated with nursing for pain management.  Pt still with significant pain when placing weight on the R LE.  Pt did have significant delay with bed mobility and required extra time and assistance with moving LE's in the bed.  Pt did perform with modA +2 from therapists to come into standing with FWW.  Pt attempted to ambulate, however has significant difficulty placing weight on the R LE and had to be given verbal cuing to use UE's on FWW to offset weight.  Pt then felt hot and started to "see things" and was assisted to the chair.  Pt blood pressure taken after 5 minutes of sitting and was found to be 116/58.  Nursing notified of pt's status and performance and left with all needs met.      If plan is discharge home, recommend the following: A lot of help with walking and/or transfers;A lot of help with bathing/dressing/bathroom;Assistance with cooking/housework;Assist for transportation;Help with stairs or ramp for entrance   Can travel by private vehicle   No    Equipment Recommendations Other (comment) (To be determined at next venue of care.)  Recommendations for Other Services       Functional Status Assessment Patient has had a recent decline in their functional status and demonstrates the ability to make significant improvements in function in a reasonable and predictable amount of time.     Precautions / Restrictions Precautions Precautions: Fall Restrictions Weight Bearing Restrictions: No RLE Weight Bearing: Weight bearing as  tolerated (per orthopedic MD note)      Mobility  Bed Mobility Overal bed mobility: Needs Assistance Bed Mobility: Supine to Sit     Supine to sit: Mod assist, +2 for physical assistance     General bed mobility comments: Assist for LB and trunk; unable to push through BIL UE to scoot hips forward on EOB.    Transfers Overall transfer level: Needs assistance Equipment used: Rolling walker (2 wheels) Transfers: Sit to/from Stand Sit to Stand: Mod assist, +2 physical assistance           General transfer comment: Pt with increase in pain with RLE weightbearing.    Ambulation/Gait Ambulation/Gait assistance: Contact guard assist, +2 physical assistance Gait Distance (Feet): 2 Feet Assistive device: Rolling walker (2 wheels) Gait Pattern/deviations: Step-to pattern, Decreased step length - left, Decreased stance time - right Gait velocity: significantly decreased     General Gait Details: pt able to take shortened steps forward with heavy UE usage on the walker.  Stairs            Wheelchair Mobility     Tilt Bed    Modified Rankin (Stroke Patients Only)       Balance Overall balance assessment: Needs assistance Sitting-balance support: Feet supported Sitting balance-Leahy Scale: Fair     Standing balance support: Bilateral upper extremity supported, During functional activity, Reliant on assistive device for balance Standing balance-Leahy Scale: Poor  Pertinent Vitals/Pain Pain Assessment Pain Assessment: Faces Faces Pain Scale: Hurts even more Pain Location: R pelvic region Pain Descriptors / Indicators: Aching, Sharp Pain Intervention(s): Limited activity within patient's tolerance, Monitored during session, Premedicated before session    Home Living Family/patient expects to be discharged to:: Private residence Living Arrangements: Alone Available Help at Discharge: Friend(s);Available  PRN/intermittently (several supportive neighbors) Type of Home: House Home Access: Stairs to enter Entrance Stairs-Rails: None (Pt states she has a grab bar by the door, but no rail) Secretary/administrator of Steps: 2   Home Layout: One level Home Equipment: Shower seat      Prior Function Prior Level of Function : Independent/Modified Independent;Driving             Mobility Comments: No AD. Denies hx of falls. ADLs Comments: (I) ADL/IADL. Drives to grocery store and to the beauty shop (states she drives 2x/week). Otherwise, she has supportive friends to help as needed, but no assist needed at baseline.     Extremity/Trunk Assessment   Upper Extremity Assessment Upper Extremity Assessment: Defer to OT evaluation    Lower Extremity Assessment Lower Extremity Assessment: Generalized weakness;RLE deficits/detail RLE Deficits / Details: significant weakness due to pain RLE: Unable to fully assess due to pain       Communication   Communication Communication: No apparent difficulties  Cognition Arousal: Alert Behavior During Therapy: WFL for tasks assessed/performed Overall Cognitive Status: Within Functional Limits for tasks assessed                                          General Comments General comments (skin integrity, edema, etc.): Pt on room air. Pt able to walk approx two feet forward with MINx2 assist at [facility] for safety; however, then pt became lightheaded and diaphoretic; stated she felt like she was going to pass out. Chair brought for pt and she was assisted to sitting; LE elevated. After 5 minutes, BP machine available; pt BP 116/58. Pt slowly improving.    Exercises     Assessment/Plan    PT Assessment Patient needs continued PT services  PT Problem List Decreased strength;Decreased activity tolerance;Decreased balance;Decreased mobility;Decreased knowledge of use of DME;Decreased safety awareness;Pain       PT Treatment Interventions  DME instruction;Gait training;Functional mobility training;Therapeutic activities;Therapeutic exercise;Balance training;Neuromuscular re-education    PT Goals (Current goals can be found in the Care Plan section)  Acute Rehab PT Goals Patient Stated Goal: To get stronger and reduce overall pain in the hip. PT Goal Formulation: With patient Time For Goal Achievement: 01/15/23 Potential to Achieve Goals: Good    Frequency Min 1X/week     Co-evaluation   Reason for Co-Treatment: Complexity of the patient's impairments (multi-system involvement);For patient/therapist safety   OT goals addressed during session: ADL's and self-care;Proper use of Adaptive equipment and DME       AM-PAC PT "6 Clicks" Mobility  Outcome Measure Help needed turning from your back to your side while in a flat bed without using bedrails?: A Lot Help needed moving from lying on your back to sitting on the side of a flat bed without using bedrails?: A Lot Help needed moving to and from a bed to a chair (including a wheelchair)?: A Lot Help needed standing up from a chair using your arms (e.g., wheelchair or bedside chair)?: A Lot Help needed to walk in hospital room?: A Lot  Help needed climbing 3-5 steps with a railing? : Total 6 Click Score: 11    End of Session Equipment Utilized During Treatment: Gait belt Activity Tolerance: Patient limited by pain Patient left: in chair;with call bell/phone within reach Nurse Communication: Mobility status PT Visit Diagnosis: Unsteadiness on feet (R26.81);Other abnormalities of gait and mobility (R26.89);Muscle weakness (generalized) (M62.81);History of falling (Z91.81);Difficulty in walking, not elsewhere classified (R26.2);Pain Pain - Right/Left: Right Pain - part of body: Hip    Time: 8416-6063 PT Time Calculation (min) (ACUTE ONLY): 36 min   Charges:   PT Evaluation $PT Eval Low Complexity: 1 Low PT Treatments $Therapeutic Activity: 8-22 mins PT General  Charges $$ ACUTE PT VISIT: 1 Visit         Nolon Bussing, PT, DPT Physical Therapist - [facility]  01/01/23, 3:54 PM

## 2023-01-01 NOTE — Consult Note (Signed)
ORTHOPEDIC CONSULTATION  Colleen [name] [id number]  01/01/2023  CC:  Chief Complaint  Patient presents with   Fall    History of Present IlIness: The patient is a 89 y.o. female who suffered a ground-level fall last night.  She was unable to bear weight because of right hip pain.  She was transported to the local emergency department where she was evaluated and a nondisplaced right sided pubic and inferior pubic ramus fracture was diagnosed.  Because of pain and unable to ambulate safety she was admitted to the hospital for discharge planning.  The pedis was consulted this morning for treatment recommendations.  Further questioning of the patient reveals that she is quite spry and independent at age 18 and lives by herself and has no family in the area.  She does have 2 neighbors that live next-door that check on her who are in the room this morning.  Admission history for completeness: Pt states that she fell accidentally when she was coming down off the stairs at home. She states that when she turned to the left, she twisted her legs and fell down on her right side.  She does state hitting her head but denies loss of consciousness.  Has noted bump on the right side of her head.  She injured her hip, causing pain in the right hip.  Her right hip is constant, sharp, 4 out of 10 in severity at rest, 10 out of 10 in severity with movement, nonradiating, aggravated by movement.  Patient denies chest pain, cough or SOB.  No nausea, vomiting, diarrhea or abdominal pain.  No symptoms of UTI.  She took her Pradaxa this morning.   PMH:  Past Medical History:  Diagnosis Date   Aortic valve disorder    Arthritis    Asthma    Atrial fibrillation (HCC)    Breast cancer (HCC) 2004   LT MASTECTOMY   Chronic kidney disease, stage 3a (HCC)    Fibrocystic disease of both breasts    GERD (gastroesophageal reflux disease)    Hx of colonic polyps    Hyperlipidemia    Hypertension    Malignant neoplasm of  breast (HCC)    left   Osteoarthritis of right knee 2019   Stroke [facility]) 2008   right peripheral vision is gone    SH:  Past Surgical History:  Procedure Laterality Date   ABDOMINAL HYSTERECTOMY  1968   APPENDECTOMY     BREAST EXCISIONAL BIOPSY Right 1986   NEG   CATARACT EXTRACTION W/ INTRAOCULAR LENS  IMPLANT, BILATERAL     COLONOSCOPY  2000, 2003, 2008, 2013   deviated septum repair  1973   ESOPHAGOGASTRODUODENOSCOPY  2013   EYE SURGERY     MASTECTOMY Left 2004   BREAST CA   TONSILLECTOMY     TOTAL KNEE ARTHROPLASTY Right 11/13/2017   Procedure: TOTAL KNEE ARTHROPLASTY;  Surgeon: Christena Flake, MD;  Location: [facility] ORS;  Service: Orthopedics;  Laterality: Right;    ALL:  Allergies  Allergen Reactions   Ibuprofen Swelling   Codeine Anxiety    Hallucination.    MED:  Medications Prior to Admission  Medication Sig Dispense Refill Last Dose   atenolol (TENORMIN) 50 MG tablet Take 50 mg by mouth 2 (two) times daily.   2    cetirizine (ZYRTEC) 10 MG tablet Take 10 mg by mouth daily.       Cholecalciferol (VITAMIN D-1000 MAX ST) 25 MCG (1000 UT) tablet Take 1,000 Units by  mouth daily.      furosemide (LASIX) 20 MG tablet Take 20 mg by mouth daily.   3    lisinopril (ZESTRIL) 40 MG tablet Take 1 tablet by mouth daily.      montelukast (SINGULAIR) 10 MG tablet Take 10 mg by mouth at bedtime.       NIFEdipine (PROCARDIA-XL/NIFEDICAL-XL) 30 MG 24 hr tablet Take 30 mg by mouth daily.      PRADAXA 150 MG CAPS capsule Take 150 mg by mouth 2 (two) times daily.   7    rosuvastatin (CRESTOR) 10 MG tablet Take 10 mg by mouth at bedtime.       acetaminophen (TYLENOL) 325 MG tablet Take 650 mg by mouth every 4 (four) hours as needed. for pain/ increased temp. May be administered orally, per G-tube if needed or rectally if unable to swallow (separate order). Maximum dose for 24 hours is 3,000 mg from all sources of Acetaminophen/ Tylenol      b complex vitamins tablet Take 1 tablet by mouth  daily.      Biotin 1 MG CAPS Take 1 capsule by mouth daily.      Calcium Carbonate-Vitamin D3 600-400 MG-UNIT TABS Take 1 tablet by mouth daily.      COVID-19 mRNA Vac-TriS, Pfizer, (PFIZER-BIONT COVID-19 VAC-TRIS) SUSP injection Inject into the muscle. 0.3 mL 0    fluticasone (FLONASE) 50 MCG/ACT nasal spray Place 2 sprays into both nostrils daily.       folic acid (FOLVITE) 400 MCG tablet Take 400 mcg by mouth daily.      Glucosamine-Chondroitin 250-200 MG TABS Take 1 tablet by mouth 2 (two) times daily.      GLUCOSAMINE-FISH OIL-EPA-DHA PO Take by mouth. Take 1 capsule  (1,200-144-216 mg) by mouth daily for supplement      Multiple Vitamin (MULTIVITAMIN WITH MINERALS) TABS tablet Take 1 tablet by mouth daily.      senna (SENOKOT) 8.6 MG TABS tablet Take 1 tablet by mouth daily.      spironolactone (ALDACTONE) 25 MG tablet Take 25 mg by mouth daily.  (Patient not taking: Reported on 12/31/2022)   Not Taking   traMADol (ULTRAM) 50 MG tablet Take one tablet by mouth three times daily as needed for mod-severe pain 30 tablet 0    UNABLE TO FIND Diet Type: NAS      vitamin C (ASCORBIC ACID) 500 MG tablet Take 1,000 mg by mouth daily.       vitamin E 400 UNIT capsule Take 400 Units by mouth daily.       All home medications have been reviewed as documented in the medication reconciliation portion of the patient record.  FH:  Family History  Problem Relation Age of Onset   Breast cancer Cousin    Breast cancer Other    Diabetes Mother    Heart disease Mother    Heart attack Mother    Heart disease Father    Heart attack Father    Heart disease Sister    Aortic aneurysm Sister    Heart attack Sister    Heart disease Brother    Stroke Paternal Grandmother    Hypertension Son    Rheum arthritis Son     Social:  reports that she quit smoking about 41 years ago. Her smoking use included cigarettes. She started smoking about 72 years ago. She has a 93 pack-year smoking history. She has  never used smokeless tobacco. She reports current alcohol use. She reports that  she does not use drugs.  Review of Systems: General: Denies fever, chills, weight loss Eyes: Denies blurry vision, changes in vision ENT: Denies sore throat, congestions, nosebleeds CV: Denies chest pain, palpitations Respiratory: Denies shortness of breath, wheezing, cough Gl: Denies abdominal pain, nausea, vomiting GU: Denies hematuria Integumentary: Denies rashes or lesions Neuro: Denies headache, dizziness Psych: Negative Hem/Onc: Denies easy bruising or bleeding disorders Musculoskeletal: See HPI above.  Vitals: BP (!) 158/71   Pulse 78   Temp 99.5 F (37.5 C)   Resp 16   Ht 5' (1.524 m)   Wt 66.7 kg   SpO2 99%   BMI 28.71 kg/m    Physical Exam: General: Awake, alert and oriented, no acute distress. Eyes: Pupils reactive, EOMI, normal conjunctiva, no scleral icterus. HENT: Normocephalic, atraumatic, normal hearing, moist oral mucosa Neck: Supple, non-tender, no cervical lymphadenopathy. Lungs: Chest rise is symmetric, non-labored respiration, chest wall nontender to palpation Heart: Normal rate by palpation, normal peripheral perfusion Abdomen: Soft, non-tender, non-distended. Pelvis is stable. Skin: Skin envelope intact, dry and pink, no rashes or lesions, no signs of infection. Neurologic: Awake, alert, and oriented X3 Psychiatric: Cooperative, appropriate mood and affect. Musculoskeletal: Evaluation of the patient's bilateral upper extremities and left lower extremity reveal no areas of tenderness or limitations to range of motion. Any motion about the patient's right hip is extremely painful. Palpation of the right distal femur, knee, tib-fib region, ankle and foot show no areas of tenderness. The patient's calves are soft and nontender with no palpable cords. Homans testing is negative. Dorsalis pedis and posterior tibial pulse are 2+ and symmetric. The patient's toes are pink and warm  with a brisk capillary refill time. The patient is able to gently move their ankles and toes on command. Sensation is intact over all lower extremity dermatomal patterns.  Radiographic findings: Radiographs of the patient's pelvis and right hip reevaluated on the PACS system.  The patient has generalized osteopenia in keeping with her age.  There is a right sided pubic fracture that extends   Labs:  Recent Labs    12/31/22 2317 01/01/23 0537  HGB 12.4 11.6*   Recent Labs    12/31/22 2317 01/01/23 0537  WBC 11.1* 8.6  RBC 4.06 3.74*  HCT 38.0 35.0*  PLT 163 157   Recent Labs    12/31/22 2317 01/01/23 0537  NA 140 140  K 2.8* 3.2*  CL 104 108  CO2 27 24  BUN 12 11  CREATININE 0.99 0.75  GLUCOSE 120* 120*  CALCIUM 9.0 8.6*   Recent Labs    12/31/22 2317  INR 1.6*     Assessment/Plan:    Assessment: 89 year old female with closed nondisplaced right sided pubic and inferior pubic rami fractures.  The patient lives alone and will require inpatient rehabilitation.   Plan: The patient can be weightbearing as tolerated on the right lower extremity with a walker.  Safety and fall precautions were emphasized to the patient.  Because she lives alone at age 66 she cannot be discharged home safely.  I would recommend discharge planning again for subacute rehabilitation.  She can follow-up in the [place] clinic in 10 to 14 days.  Orthopedic discharge instructions have been placed in the chart.  Cecil Cranker M.D. 01/01/2023 10:59 AM

## 2023-01-01 NOTE — Discharge Instructions (Signed)
ORTHOPEDIC DISCHARGE INSTRUCTIONS  Follow Up Appointment:  Follow-up in the office in 10-14 days.  Please contact the [facility] at [phone]  to schedule your follow-up appointment.  Weight-bearing status:  You are weightbearing as tolerated on the RIGHT LOWER EXTREMITY with your walker/crutches.   Diet:   You may resume your regular diet as tolerated.  Begin with clear liquids and slowly advance to your normal diet.  Medications:   Take your medicines as prescribed. You may have written prescriptions or your prescriptions may have been E-prescribed to your pharmacy. If you were given prescriptions for any blood thinners, it is a very important that you take these medications as prescribed to prevent blood clots.  General instructions:  Elevate the affected extremity to control pain and swelling. Apply ice packs to the affected area to control pain and swelling.  Surgery and pain medications may lead to constipation. It is easier to prevent this than it is to treat it. We recommend MiraLAX or Senna/Senakot for laxatives and Colace as a stool softener as needed after surgery. Drink plenty of fluids to stay well-hydrated. Consult your local pharmacist for other treatment recommendations.   Please perform deep breathing exercises every hour to increase the airflow in your lungs which could predispose you to running a low-grade fever and increase your risk of pneumonia.

## 2023-01-01 NOTE — TOC Progression Note (Signed)
Transition of Care [facility]) - Progression Note    Patient Details  Name: Colleen [name] MRN: [id number] Date of Birth: 1930-12-14  [facility] of [facility]) CM/SW Contact  Marlowe Sax, RN Phone Number: 01/01/2023, 4:14 PM  Clinical Narrative:     The patient lives alone and has chosen family, neighbors help her, she will need to go to [employer], [employer] sent, waiting on Bed offers, will review       Expected Discharge Plan and Services                                               Social Determinants of Health (SDOH) Interventions SDOH Screenings   Food Insecurity: No Food Insecurity (12/31/2022)  Housing: Low Risk  (12/31/2022)  Transportation Needs: No Transportation Needs (12/31/2022)  Utilities: Not At Risk (12/31/2022)  Financial Resource Strain: Low Risk  (11/07/2022)   Received from [facility]  Physical Activity: Unknown (03/02/2018)   Received from [facility], [facility]  Social Connections: Unknown (03/02/2018)   Received from [facility], [facility]  Tobacco Use: Medium Risk (12/31/2022)    Readmission Risk Interventions     No data to display

## 2023-01-01 NOTE — Progress Notes (Signed)
PT Cancellation Note  Patient Details Name: Colleen [name] MRN: [id number] DOB: 11-02-1930   Cancelled Treatment:    Reason Eval/Treat Not Completed: Other (comment).  Chart reviewed and attempted to see pt.  Pt currently refusing therapy due to the pain she experiences with any mobility of the R LE.  Pt advised that therapy would coordinate with nursing staff and pain medication in order to assist with management of pain during mobility.  Will re-attempt as time allows.    Nolon Bussing, PT, DPT Physical Therapist - [facility]  01/01/23, 12:26 PM

## 2023-01-01 NOTE — Evaluation (Signed)
Occupational Therapy Evaluation Patient Details Name: Colleen [name] MRN: [id number] DOB: 06-21-1930 Today's Date: 01/01/2023   History of Present Illness Colleen [name] is a 89 y/o F with PMH including L mastectomy and breast cancer, HTN, storke, CKD3, a-fib. Admitted after falling on stairs at home; has pubic fx.   Clinical Impression   Patient received for OT evaluation. See flowsheet below for details of function. Generally, patient requiring MOD A x2 for bed mobility, MIN-MOD A x2 for functional mobility (briefly, until pt became lightheaded and presyncopal), and set up-MAX A for ADLs. Patient will benefit from continued OT while in acute care.        If plan is discharge home, recommend the following: Two people to help with walking and/or transfers;A lot of help with bathing/dressing/bathroom;Assistance with cooking/housework;Direct supervision/assist for medications management;Direct supervision/assist for financial management;Assist for transportation;Help with stairs or ramp for entrance    Functional Status Assessment  Patient has had a recent decline in their functional status and demonstrates the ability to make significant improvements in function in a reasonable and predictable amount of time.  Equipment Recommendations  Other (comment) (defer to next venue of care)    Recommendations for Other Services       Precautions / Restrictions Precautions Precautions: Fall Restrictions Weight Bearing Restrictions: No RLE Weight Bearing: Weight bearing as tolerated (per orthopedic MD note)      Mobility Bed Mobility Overal bed mobility: Needs Assistance Bed Mobility: Supine to Sit     Supine to sit: Mod assist, +2 for physical assistance     General bed mobility comments: Assist for LB and trunk; unable to push through BIL UE to scoot hips forward on EOB.    Transfers Overall transfer level: Needs assistance Equipment used: Rolling walker (2 wheels) Transfers: Sit  to/from Stand Sit to Stand: Mod assist, +2 physical assistance           General transfer comment: Pt with increase in pain with RLE weightbearing.      Balance Overall balance assessment: Needs assistance Sitting-balance support: Feet supported Sitting balance-Leahy Scale: Fair     Standing balance support: Bilateral upper extremity supported, During functional activity, Reliant on assistive device for balance Standing balance-Leahy Scale: Poor                             ADL either performed or assessed with clinical judgement   ADL Overall ADL's : Needs assistance/impaired Eating/Feeding: Independent   Grooming: Set up;Bed level;Brushing hair               Lower Body Dressing: Maximal assistance Lower Body Dressing Details (indicate cue type and reason): Assisted for donning slip on shoes at bedside; anticipate significant assist given pubic fx pain today               General ADL Comments: Anticipate significant assist for ADLs, especially when standing, at this time given that pt became orthostatic in standing, was only able to walk forward approx 2 feet, and heavily relied on RW.  Able to sit for ADLs, but LB ADLs particularly impaired.     Vision Baseline Vision/History: 1 Wears glasses (has R visual field deficit after CVA) Ability to See in Adequate Light: 1 Impaired Patient Visual Report: Peripheral vision impairment (from prior CVA)       Perception         Praxis         Pertinent Vitals/Pain  Pain Assessment Pain Assessment: 0-10 Pain Score:  (unrated; high with activity) Pain Location: R pelvic region Pain Descriptors / Indicators: Aching, Sharp Pain Intervention(s): Limited activity within patient's tolerance, Monitored during session, Premedicated before session     Extremity/Trunk Assessment Upper Extremity Assessment Upper Extremity Assessment: Generalized weakness;Overall [facility] for tasks assessed   Lower Extremity  Assessment Lower Extremity Assessment: Defer to PT evaluation       Communication Communication Communication: No apparent difficulties   Cognition Arousal: Alert Behavior During Therapy: WFL for tasks assessed/performed Overall Cognitive Status: Within Functional Limits for tasks assessed                                       General Comments  Pt on room air. Pt able to walk approx two feet forward with MINx2 assist at [facility] for safety; however, then pt became lightheaded and diaphoretic; stated she felt like she was going to pass out. Chair brought for pt and she was assisted to sitting; LE elevated. After 5 minutes, BP machine available; pt BP 116/58. Pt slowly improving.    Exercises     Shoulder Instructions      Home Living Family/patient expects to be discharged to:: Private residence Living Arrangements: Alone Available Help at Discharge: Friend(s);Available PRN/intermittently (several supportive neighbors) Type of Home: House Home Access: Stairs to enter [employer] of Steps: 2 Entrance Stairs-Rails: None (Pt states she has a grab bar by the door, but no rail) Home Layout: One level     Bathroom Shower/Tub: Producer, television/film/video: Handicapped height     Home Equipment: Shower seat          Prior Functioning/Environment Prior Level of Function : Independent/Modified Independent;Driving             Mobility Comments: No AD. Denies hx of falls. ADLs Comments: (I) ADL/IADL. Drives to grocery store and to the beauty shop (states she drives 2x/week). Otherwise, she has supportive friends to help as needed, but no assist needed at baseline.        OT Problem List: Decreased strength;Decreased activity tolerance;Impaired balance (sitting and/or standing);Pain      OT Treatment/Interventions: Self-care/ADL training;Therapeutic exercise;DME and/or AE instruction;Therapeutic activities;Patient/family education    OT  Goals(Current goals can be found in the care plan section) Acute Rehab OT Goals Patient Stated Goal: Get better; pain to stop OT Goal Formulation: With patient Time For Goal Achievement: 01/15/23 Potential to Achieve Goals: Good ADL Goals Pt Will Perform Lower Body Bathing: sit to/from stand;with modified independence Pt Will Perform Lower Body Dressing: with modified independence;sit to/from stand;with adaptive equipment Pt Will Transfer to Toilet: with modified independence;bedside commode Pt Will Perform Toileting - Clothing Manipulation and hygiene: with modified independence;sit to/from stand  OT Frequency: Min 1X/week    Co-evaluation PT/OT/SLP Co-Evaluation/Treatment: Yes Reason for Co-Treatment: Complexity of the patient's impairments (multi-system involvement);For patient/therapist safety   OT goals addressed during session: ADL's and self-care;Proper use of Adaptive equipment and DME      AM-PAC OT "6 Clicks" Daily Activity     Outcome Measure Help from another person eating meals?: None Help from another person taking care of personal grooming?: A Little Help from another person toileting, which includes using toliet, bedpan, or urinal?: A Lot Help from another person bathing (including washing, rinsing, drying)?: A Lot Help from another person to put on and taking off regular upper  body clothing?: None Help from another person to put on and taking off regular lower body clothing?: A Lot 6 Click Score: 17   End of Session Equipment Utilized During Treatment: Rolling walker (2 wheels);Gait belt Nurse Communication: Mobility status;Other (comment) (no chair alarm on)  Activity Tolerance: Other (comment) (limited 2/2 presyncope) Patient left: in chair;with call bell/phone within reach (RN aware that pt does not have chair alarm in place.)  OT Visit Diagnosis: Unsteadiness on feet (R26.81);History of falling (Z91.81)                Time: 1308-6578 OT Time Calculation  (min): 38 min Charges:  OT General Charges $OT Visit: 1 Visit OT Evaluation $OT Eval Moderate Complexity: 1 Mod OT Treatments $Therapeutic Activity: 8-22 mins  Linward Foster, MS, OTR/L  Alvester Morin 01/01/2023, 2:31 PM

## 2023-01-01 NOTE — Progress Notes (Signed)
   01/01/23 1100  Spiritual Encounters  Type of Visit Initial  Care provided to: Pt and family  Referral source Nurse (RN/NT/LPN)  Reason for visit Advance directives  OnCall Visit Yes   Chaplain responded to [facility] consult to help create or update Advance Directive. Patient declined.

## 2023-01-01 NOTE — Progress Notes (Signed)
Progress Note   Patient: Colleen [name]:[id number] DOB: 05-05-1931 DOA: 12/31/2022     0 DOS: the patient was seen and examined on 01/01/2023   Brief hospital course: Pt admitted after a fall and found to have pubic fracture. See H&P for full HPI on admission.  Orthopedic surgery consulted.   PT/OT evaluations. Patient will require SNF/rehab placement as she lives alone without 24/7 caregiver support.       Assessment and Plan:  Pubic bone fracture and fall at home (initial encounter): X-ray showed nondisplaced right-sided pubic bone fracture near the symphysis and inferior pubic ramus.  Most likely not need surgery.  Pt initially declined opiate therapy, but agreed to tramadol today (8/26) --Ortho consulted - Appreciate Dr. Polly Cobia recs --Schedule Tylenol --PRN Tramadol --Lidoderm patch if area is amenable --PRN Robaxin for muscle spasm --PT/OT evaluations --SNF recommended --TOC following for placement   Chronic diastolic CHF (congestive heart failure) (HCC): 2D echo on 11/18/2017 showed EF of 60-65%.   Appears compensated. -Continue home Lasix -Monitor volume status - daily weights   CAD (coronary artery disease): S/p of stent placement.  No chest pain -Crestor   Hypertension - BP's elevated in part due to pain and stress -IV hydralazine as needed -Atenolol, Lasix, lisinopril, nifedipine   Hyperlipidemia -Crestor   Atrial fibrillation, chronic (HCC): HR's controlled -Continue atenolol -Continue Pradaxa   Stroke (HCC) -Crestor and Pradaxa   Asthma: Stable -Singulair -Bronchodilators as needed   Chronic kidney disease, stage 3a (HCC): stable Recent creatinine 1.0 on 10/23/2022. --Monitor BMP   Hypokalemia: Potassium 2.8 on admission was replaced. K 3.2 this AM, further replacement ordered --Monitor BMP, Mg levels           Subjective: Pt seen with her Colleen [name] ("Colleen [name]") at bedside this AM.  Pt reports minimal pain when resting in bed,  but has extreme pain with any movement, use of bed pan, or attempt to bear weight.  She agrees to scheduled Tylenol.  Lives alone without 24/7 support, no one that could stay overnight for several weeks while she recovers.  Agreeable to SNF for rehab.  Physical Exam: Vitals:   12/31/22 2100 12/31/22 2313 01/01/23 0829 01/01/23 1425  BP: (!) 153/93 (!) 149/81 (!) 158/71 104/78  Pulse: 91 100 78 84  Resp:  (!) 22 16 16   Temp:  99 F (37.2 C) 99.5 F (37.5 C) 98.2 F (36.8 C)  TempSrc:   Oral   SpO2:  92% 99% 98%  Weight:      Height:       General exam: awake, alert, no acute distress HEENT: atraumatic, clear conjunctiva, anicteric sclera, moist mucus membranes, hearing grossly normal  Respiratory system: CTA, no wheezes, rales or rhonchi, normal respiratory effort. Cardiovascular system: normal S1/S2, RRR, no JVD, murmurs, rubs, gallops, no pedal edema.   Gastrointestinal system: soft, NT, ND, no HSM felt, +bowel sounds. Central nervous system: A&O x 4. no gross focal neurologic deficits, normal speech Extremities: no edema, normal tone Skin: dry, intact, normal temperature Psychiatry: normal mood, congruent affect, judgement and insight appear normal   Data Reviewed:  Notable labs --- K 3.2 from 2.8, glucose 120, Ca 8.6, Hbg 11.6, WBC normalized 11.1 >> 8.6  [name] Communication: Colleen [name] / [name] at bedside on rounds  Disposition: Status is: Inpatient Remains inpatient appropriate because: Needs SNF placement   Planned Discharge Destination: Skilled nursing facility    Time spent: 38 minutes  Author: Pennie Banter, DO 01/01/2023 7:30 PM  For on call review [email or web address].

## 2023-01-01 NOTE — Plan of Care (Signed)

## 2023-01-01 NOTE — [place] FL2 (Signed)
[place] MEDICAID FL2 LEVEL OF CARE FORM     IDENTIFICATION  Patient Name: Colleen [name] Birthdate: [birth year] Sex: female Admission Date (Current Location): 12/31/2022  [facility] and [place] Number:  Chiropodist and Address:  [facility], [address] [place], [place] [address]      Provider Number: [id number]  Attending Physician Name and Address:  Pennie Banter, DO  Relative Name and Phone Number:  Joylene John [phone]    Current Level of Care: Hospital Recommended Level of Care: Skilled Nursing Facility Prior Approval Number:    Date Approved/Denied:   PASRR Number: [id number]  Discharge Plan: SNF    Current Diagnoses: Patient Active Problem List   Diagnosis Date Noted   Hypokalemia 01/01/2023   Closed pelvic fracture (HCC) 12/31/2022   Fall at home, initial encounter 12/31/2022   Chronic diastolic CHF (congestive heart failure) (HCC) 12/31/2022   CAD (coronary artery disease) 12/31/2022   Pubic bone fracture (HCC) 12/31/2022   Hypertension    Hyperlipidemia    Atrial fibrillation, chronic (HCC)    Asthma    Chronic kidney disease, stage 3a (HCC)    Sepsis (HCC) 11/16/2017   Status post total knee replacement using cement, right 11/13/2017   Stroke (HCC) 2008    Orientation RESPIRATION BLADDER Height & Weight     Self, Time, Situation, Place  Normal, O2 (2 liters) Continent, External catheter Weight: 66.7 kg Height:  5' (152.4 cm)  BEHAVIORAL SYMPTOMS/MOOD NEUROLOGICAL BOWEL NUTRITION STATUS      Continent Diet (See DC summary)  AMBULATORY STATUS COMMUNICATION OF NEEDS Skin   Extensive Assist Verbally Normal                       Personal Care Assistance Level of Assistance  Bathing, Feeding, Dressing Bathing Assistance: Maximum assistance Feeding assistance: Limited assistance Dressing Assistance: Maximum assistance     Functional Limitations Info  Sight, Hearing, Speech Sight Info:  Adequate Hearing Info: Adequate Speech Info: Adequate    SPECIAL CARE FACTORS FREQUENCY  PT (By licensed PT), OT (By licensed OT)     PT Frequency: 5 times per week OT Frequency: 5 times per week            Contractures Contractures Info: Not present    Additional Factors Info                  Current Medications (01/01/2023):  This is the current hospital active medication list Current Facility-Administered Medications  Medication Dose Route Frequency Provider Last Rate Last Admin   acetaminophen (TYLENOL) tablet 1,000 mg  1,000 mg Oral Q8H Griffith, Kelly A, DO   1,000 mg at 01/01/23 1419   albuterol (PROVENTIL) (2.5 MG/3ML) 0.083% nebulizer solution 2.5 mg  2.5 mg Inhalation Q4H PRN Lorretta Harp, MD       atenolol (TENORMIN) tablet 50 mg  50 mg Oral BID Lorretta Harp, MD   50 mg at 01/01/23 0945   dabigatran (PRADAXA) capsule 150 mg  150 mg Oral BID Lorretta Harp, MD   150 mg at 01/01/23 0945   dextromethorphan-guaiFENesin (MUCINEX DM) 30-600 MG per 12 hr tablet 1 tablet  1 tablet Oral BID PRN Lorretta Harp, MD       furosemide (LASIX) tablet 20 mg  20 mg Oral Daily Lorretta Harp, MD   20 mg at 01/01/23 0945   hydrALAZINE (APRESOLINE) injection 5 mg  5 mg Intravenous Q2H PRN Pennie Banter, DO  hydrALAZINE (APRESOLINE) tablet 25 mg  25 mg Oral Q6H PRN Esaw Grandchild A, DO       lidocaine (LIDODERM) 5 % 1 patch  1 patch Transdermal Q24H Janith Lima, MD   1 patch at 12/31/22 2321   lisinopril (ZESTRIL) tablet 40 mg  40 mg Oral Daily Lorretta Harp, MD   40 mg at 01/01/23 0945   loratadine (CLARITIN) tablet 10 mg  10 mg Oral Daily Lorretta Harp, MD   10 mg at 01/01/23 0945   methocarbamol (ROBAXIN) tablet 500 mg  500 mg Oral Q8H PRN Lorretta Harp, MD   500 mg at 01/01/23 0236   montelukast (SINGULAIR) tablet 10 mg  10 mg Oral QHS Lorretta Harp, MD   10 mg at 12/31/22 2320   NIFEdipine (ADALAT CC) 24 hr tablet 60 mg  60 mg Oral Daily Lorretta Harp, MD   60 mg at 01/01/23 0945   ondansetron  (ZOFRAN) injection 4 mg  4 mg Intravenous Q8H PRN Lorretta Harp, MD       rosuvastatin (CRESTOR) tablet 10 mg  10 mg Oral QHS Lorretta Harp, MD   10 mg at 12/31/22 2320   senna (SENOKOT) tablet 8.6 mg  1 tablet Oral Daily PRN Lorretta Harp, MD       traMADol Janean Sark) tablet 50 mg  50 mg Oral Q6H PRN Esaw Grandchild A, DO   50 mg at 01/01/23 1242     Discharge Medications: Please see discharge summary for a list of discharge medications.  Relevant Imaging Results:  Relevant Lab Results:   Additional Information SS# [id number]  Marlowe Sax, RN

## 2023-01-02 DIAGNOSIS — I5032 Chronic diastolic (congestive) heart failure: Secondary | ICD-10-CM | POA: Diagnosis not present

## 2023-01-02 DIAGNOSIS — E876 Hypokalemia: Secondary | ICD-10-CM | POA: Diagnosis not present

## 2023-01-02 DIAGNOSIS — N1831 Chronic kidney disease, stage 3a: Secondary | ICD-10-CM | POA: Diagnosis not present

## 2023-01-02 LAB — BASIC METABOLIC PANEL
Anion gap: 4 — ABNORMAL LOW (ref 5–15)
BUN: 18 mg/dL (ref 8–23)
CO2: 28 mmol/L (ref 22–32)
Calcium: 8.6 mg/dL — ABNORMAL LOW (ref 8.9–10.3)
Chloride: 107 mmol/L (ref 98–111)
Creatinine, Ser: 0.92 mg/dL (ref 0.44–1.00)
GFR, Estimated: 59 mL/min — ABNORMAL LOW (ref >60)
Glucose, Bld: 95 mg/dL (ref 70–99)
Potassium: 3.5 mmol/L (ref 3.5–5.1)
Sodium: 139 mmol/L (ref 135–145)

## 2023-01-02 LAB — MAGNESIUM: Magnesium: 2.2 mg/dL (ref 1.7–2.4)

## 2023-01-02 MED ORDER — POLYETHYLENE GLYCOL 3350 17 G PO PACK
17.0000 g | PACK | Freq: Every day | ORAL | Status: DC
  Administered 2023-01-02 – 2023-01-03 (×2): 17 g via ORAL
  Filled 2023-01-02 (×2): qty 1

## 2023-01-02 MED ORDER — SENNOSIDES-DOCUSATE SODIUM 8.6-50 MG PO TABS
1.0000 | ORAL_TABLET | Freq: Two times a day (BID) | ORAL | Status: DC
  Administered 2023-01-02 – 2023-01-03 (×2): 1 via ORAL
  Filled 2023-01-02 (×2): qty 1

## 2023-01-02 NOTE — Progress Notes (Signed)
Physical Therapy Treatment Patient Details Name: Colleen [name] MRN: [id number] DOB: Aug 06, 1930 Today's Date: 01/02/2023   History of Present Illness Colleen [name] is a 89 y/o F with PMH including L mastectomy and breast cancer, HTN, storke, CKD3, a-fib. Admitted after falling on stairs at home; has pubic fx.    PT Comments  Pt ready for session.  Asking for pain meds and messaged RN.  She does want to get up and is assisted slowly to EOB with mod a x 1.  She stands with min a x 1 from elevated bed with increased time.  She is able to slowly step to chair.  She clears foot but steps remain small with vc's for sequencing.  She transitions to sit with min a x 1 to assist.  Seated AROM x 10.  Remained in chair with needs met.   If plan is discharge home, recommend the following: A lot of help with walking and/or transfers;A lot of help with bathing/dressing/bathroom;Assistance with cooking/housework;Assist for transportation;Help with stairs or ramp for entrance   Can travel by private vehicle     No  Equipment Recommendations       Recommendations for Other Services       Precautions / Restrictions Precautions Precautions: Fall Restrictions Weight Bearing Restrictions: No RLE Weight Bearing: Weight bearing as tolerated (per orthopedic MD note)     Mobility  Bed Mobility Overal bed mobility: Needs Assistance Bed Mobility: Supine to Sit     Supine to sit: Mod assist     General bed mobility comments: does well transitioning to EOB with +1 but will likely need +2 to return for comfort Patient Response: Cooperative  Transfers Overall transfer level: Needs assistance Equipment used: Rolling walker (2 wheels) Transfers: Sit to/from Stand Sit to Stand: Min assist, From elevated surface           General transfer comment: increased time    Ambulation/Gait Ambulation/Gait assistance: Min assist Gait Distance (Feet): 2 Feet Assistive device: Rolling walker (2 wheels) Gait  Pattern/deviations: Step-to pattern, Decreased step length - left, Decreased stance time - right Gait velocity: significantly decreased     General Gait Details: very small shuffling steps but is able to move feet on her own   Stairs             Wheelchair Mobility     Tilt Bed Tilt Bed Patient Response: Cooperative  Modified Rankin (Stroke Patients Only)       Balance Overall balance assessment: Needs assistance Sitting-balance support: Feet supported Sitting balance-Leahy Scale: Fair     Standing balance support: Bilateral upper extremity supported, During functional activity, Reliant on assistive device for balance Standing balance-Leahy Scale: Poor Standing balance comment: heavy reliance on walker                            Cognition Arousal: Alert Behavior During Therapy: WFL for tasks assessed/performed Overall Cognitive Status: Within Functional Limits for tasks assessed                                          Exercises Other Exercises Other Exercises: seated AROM    General Comments        Pertinent Vitals/Pain Pain Assessment Pain Assessment: Faces Faces Pain Scale: Hurts even more Pain Location: R pelvic region Pain Descriptors / Indicators: Aching, Sharp Pain  Intervention(s): Limited activity within patient's tolerance, Monitored during session, Patient requesting pain meds-RN notified, Repositioned    Home Living                          Prior Function            PT Goals (current goals can now be found in the care plan section) Progress towards PT goals: Progressing toward goals    Frequency    Min 1X/week      PT Plan      Co-evaluation              AM-PAC PT "6 Clicks" Mobility   Outcome Measure  Help needed turning from your back to your side while in a flat bed without using bedrails?: A Lot Help needed moving from lying on your back to sitting on the side of a flat bed  without using bedrails?: A Lot Help needed moving to and from a bed to a chair (including a wheelchair)?: A Little Help needed standing up from a chair using your arms (e.g., wheelchair or bedside chair)?: A Little Help needed to walk in hospital room?: A Lot Help needed climbing 3-5 steps with a railing? : Total 6 Click Score: 13    End of Session Equipment Utilized During Treatment: Gait belt Activity Tolerance: Patient limited by pain Patient left: in chair;with call bell/phone within reach;with family/visitor present;with chair alarm set Nurse Communication: Mobility status PT Visit Diagnosis: Unsteadiness on feet (R26.81);Other abnormalities of gait and mobility (R26.89);Muscle weakness (generalized) (M62.81);History of falling (Z91.81);Difficulty in walking, not elsewhere classified (R26.2);Pain Pain - Right/Left: Right Pain - part of body: Hip     Time: 8469-6295 PT Time Calculation (min) (ACUTE ONLY): 17 min  Charges:    $Therapeutic Activity: 8-22 mins PT General Charges $$ ACUTE PT VISIT: 1 Visit                   Danielle Dess, PTA 01/02/23, 12:50 PM

## 2023-01-02 NOTE — TOC Progression Note (Signed)
Transition of Care [facility]) - Progression Note    Patient Details  Name: Colleen [name] MRN: [id number] Date of Birth: 04-26-1931  Transition of Care [facility]) CM/SW Contact  Marlowe Sax, RN Phone Number: 01/02/2023, 2:31 PM  Clinical Narrative:    Reviewed the STR Snf Bed offers with the patietn, she chose [employer], to DC tomorrow        Expected Discharge Plan and Services                                               Social Determinants of Health (SDOH) Interventions SDOH Screenings   Food Insecurity: No Food Insecurity (12/31/2022)  Housing: Low Risk  (12/31/2022)  Transportation Needs: No Transportation Needs (12/31/2022)  Utilities: Not At Risk (12/31/2022)  Financial Resource Strain: Low Risk  (11/07/2022)   Received from [facility]  Physical Activity: Unknown (03/02/2018)   Received from [facility], [facility]  Social Connections: Unknown (03/02/2018)   Received from [facility], [facility]  Tobacco Use: Medium Risk (12/31/2022)    Readmission Risk Interventions     No data to display

## 2023-01-02 NOTE — Plan of Care (Signed)

## 2023-01-02 NOTE — Progress Notes (Signed)
Patient ID: Colleen [name], female   DOB: 1930-11-08, 89 y.o.   MRN: [id number]  Subjective: The patient still notes moderate pain in her right hip/groin region, especially with any attempted weightbearing activities.  She still requires significant assistance when getting from bed to chair.  She denies any new complaints pertaining to her right hip or lower extremity.   Objective: Vital signs in last 24 hours: Temp:  [97.4 F (36.3 C)-97.8 F (36.6 C)] 97.4 F (36.3 C) (08/27 0809) Pulse Rate:  [84-91] 84 (08/27 0809) Resp:  [16-18] 16 (08/27 0809) BP: (127-133)/(50-58) 127/58 (08/27 0809) SpO2:  [98 %] 98 % (08/27 0809)  Intake/Output from previous day: 08/26 0701 - 08/27 0700 In: 120 [P.O.:120] Out: -  Intake/Output this shift: Total I/O In: 240 [P.O.:240] Out: 100 [Urine:100]  Recent Labs    12/31/22 2317 01/01/23 0537  HGB 12.4 11.6*   Recent Labs    12/31/22 2317 01/01/23 0537  WBC 11.1* 8.6  RBC 4.06 3.74*  HCT 38.0 35.0*  PLT 163 157   Recent Labs    01/01/23 0537 01/02/23 0502  NA 140 139  K 3.2* 3.5  CL 108 107  CO2 24 28  BUN 11 18  CREATININE 0.75 0.92  GLUCOSE 120* 95  CALCIUM 8.6* 8.6*   Recent Labs    12/31/22 2317  INR 1.6*    Physical Exam: Orthopedic examination is limited to the right hip and lower extremity.  Skin inspection around the right hip and thigh is unremarkable.  No swelling, erythema, ecchymosis, abrasions, or other skin abnormalities are identified.  She has mild tenderness to palpation over the anterior aspect of the hip.  She has more severe pain with and attempted straight leg raise, but notes only minimal discomfort with gentle logrolling of her right leg.  She is grossly neurovascularly intact to the right lower extremity and foot.  X-rays: X-rays of the pelvis and right hip are available for review and have been reviewed by myself.  These films demonstrate a nondisplaced right inferior pubic ramus fracture with  extension to the right side of the pubic symphysis.  Early degenerative changes of the hip joint are noted, as are generalized osteopenia of all of the visualized bones.  No lytic lesions or other acute bony processes are identified.  Assessment: Nondisplaced right inferior pubic ramus fracture.  Plan: The treatment options have been reviewed with the patient.  The patient is reassured that this injury is treated nonsurgically and has a high likelihood of healing well, although it may take 6 to 8 weeks to fully heal.  She may be mobilized with physical therapy, weightbearing as tolerated on the right leg with a walker for balance and support.  She may receive medication as deemed appropriate for pain as necessary.  Most likely, this patient will require rehab placement for period of time as she lives alone.   Excell Seltzer Leiyah Maultsby 01/02/2023, 4:44 PM

## 2023-01-02 NOTE — Progress Notes (Signed)
  Progress Note   Patient: Colleen [name] NWG:[id number] DOB: May 16, 1930 DOA: 12/31/2022     1 DOS: the patient was seen and examined on 01/02/2023   Brief hospital course: Pt admitted after a fall and found to have pubic fracture. See H&P for full HPI on admission.  Orthopedic surgery consulted.   PT/OT evaluations. Patient will require SNF/rehab placement as she lives alone without 24/7 caregiver support.       Assessment and Plan:  Pubic bone fracture and fall at home (initial encounter): X-ray showed nondisplaced right-sided pubic bone fracture near the symphysis and inferior pubic ramus.  Most likely not need surgery.  Pt initially declined opiate therapy, but agreed to tramadol today (8/26) --Ortho consulted - Appreciate Dr. Polly Cobia recs --Schedule Tylenol --PRN Tramadol --Lidoderm patch if area is amenable --PRN Robaxin for muscle spasm --PT/OT evaluations --SNF recommended --TOC following for placement   Chronic diastolic CHF (congestive heart failure) (HCC): 2D echo on 11/18/2017 showed EF of 60-65%.  Appears compensated. -Continue home Lasix -Monitor volume status - daily weights   CAD (coronary artery disease): S/p of stent placement.  No chest pain -Crestor   Hypertension - BP's elevated in part due to pain and stress -IV hydralazine as needed -Atenolol, Lasix, lisinopril, nifedipine   Hyperlipidemia -Crestor   Atrial fibrillation, chronic (HCC): HR's controlled -Continue atenolol -Continue Pradaxa   Stroke (HCC) -Crestor and Pradaxa   Asthma: Stable -Singulair -Bronchodilators as needed   Chronic kidney disease, stage 3a (HCC): stable Recent creatinine 1.0 on 10/23/2022. --Monitor BMP   Hypokalemia: Potassium 2.8 on admission replaced. K 3.2 on 8/26 - replaced K 3.5 today --Monitor BMP, Mg levels           Subjective: Pt seen with her neighbor at bedside this AM.  Pt reports being comfortable without much pain as long as she does not move.   Unable to tolerate positioning onto a bed pain.  No BM, requests stool softeners.  No other complaints.   Physical Exam: Vitals:   01/01/23 0829 01/01/23 1425 01/01/23 2200 01/02/23 0809  BP: (!) 158/71 104/78 (!) 133/50 (!) 127/58  Pulse: 78 84 91 84  Resp: 16 16 18 16   Temp: 99.5 F (37.5 C) 98.2 F (36.8 C) 97.8 F (36.6 C) (!) 97.4 F (36.3 C)  TempSrc: Oral     SpO2: 99% 98% 98% 98%  Weight:      Height:       General exam: awake, alert, no acute distress HEENT: atraumatic, clear conjunctiva, anicteric sclera, moist mucus membranes, hearing grossly normal  Respiratory system: CTAB, no wheezes, rales or rhonchi, normal respiratory effort. Cardiovascular system: normal S1/S2, RRR, no pedal edema.   Gastrointestinal system: soft, NT, ND, no HSM felt, +bowel sounds. Central nervous system: A&O x 4. no gross focal neurologic deficits, normal speech Extremities: no edema, normal tone Skin: dry, intact, normal temperature Psychiatry: normal mood, congruent affect, judgement and insight appear normal   Data Reviewed:  Notable labs --- BMP normal except Ca 8.6, gap 4  Family Communication: neighbor at bedside on rounds  Disposition: Status is: Inpatient Remains inpatient appropriate because: Needs SNF placement   Planned Discharge Destination: Skilled nursing facility    Time spent: 35 minutes  Author: Pennie Banter, DO 01/02/2023 2:10 PM  For on call review [email or web address].

## 2023-01-03 DIAGNOSIS — S32509A Unspecified fracture of unspecified pubis, initial encounter for closed fracture: Secondary | ICD-10-CM | POA: Diagnosis not present

## 2023-01-03 DIAGNOSIS — W19XXXA Unspecified fall, initial encounter: Secondary | ICD-10-CM | POA: Diagnosis not present

## 2023-01-03 DIAGNOSIS — I10 Essential (primary) hypertension: Secondary | ICD-10-CM | POA: Diagnosis not present

## 2023-01-03 DIAGNOSIS — I482 Chronic atrial fibrillation, unspecified: Secondary | ICD-10-CM | POA: Diagnosis not present

## 2023-01-03 LAB — BASIC METABOLIC PANEL
Anion gap: 7 (ref 5–15)
BUN: 16 mg/dL (ref 8–23)
CO2: 27 mmol/L (ref 22–32)
Calcium: 8.5 mg/dL — ABNORMAL LOW (ref 8.9–10.3)
Chloride: 104 mmol/L (ref 98–111)
Creatinine, Ser: 0.88 mg/dL (ref 0.44–1.00)
GFR, Estimated: >60 mL/min (ref >60)
Glucose, Bld: 91 mg/dL (ref 70–99)
Potassium: 3.2 mmol/L — ABNORMAL LOW (ref 3.5–5.1)
Sodium: 138 mmol/L (ref 135–145)

## 2023-01-03 MED ORDER — SENNOSIDES-DOCUSATE SODIUM 8.6-50 MG PO TABS: 1.0000 | ORAL_TABLET | Freq: Two times a day (BID) | ORAL | Status: AC

## 2023-01-03 MED ORDER — POTASSIUM CHLORIDE CRYS ER 20 MEQ PO TBCR
40.0000 meq | EXTENDED_RELEASE_TABLET | ORAL | Status: AC
  Administered 2023-01-03: 40 meq via ORAL
  Filled 2023-01-03: qty 2

## 2023-01-03 MED ORDER — LIDOCAINE 5 % EX PTCH: 1.0000 | MEDICATED_PATCH | CUTANEOUS | 0 refills | Status: AC

## 2023-01-03 MED ORDER — TRAMADOL HCL 50 MG PO TABS: 50.0000 mg | ORAL_TABLET | Freq: Four times a day (QID) | ORAL | 0 refills | Status: AC | PRN

## 2023-01-03 MED ORDER — POLYETHYLENE GLYCOL 3350 17 G PO PACK: 17.0000 g | PACK | Freq: Every day | ORAL | Status: AC

## 2023-01-03 NOTE — TOC Progression Note (Signed)
Transition of Care [facility]) - Progression Note    Patient Details  Name: Colleen [name] MRN: [id number] Date of Birth: [birth year]  Transition of Care [facility]) CM/SW Contact  Marlowe Sax, RN Phone Number: 01/03/2023, 10:50 AM  Clinical Narrative:    Going to [facility] called, she is first on their list        Expected Discharge Plan and Services         Expected Discharge Date: 01/03/23                                     Social Determinants of Health (SDOH) Interventions SDOH Screenings   Food Insecurity: No Food Insecurity (12/31/2022)  Housing: Low Risk  (12/31/2022)  Transportation Needs: No Transportation Needs (12/31/2022)  Utilities: Not At Risk (12/31/2022)  Financial Resource Strain: Low Risk  (11/07/2022)   Received from [facility]  Physical Activity: Unknown (03/02/2018)   Received from [facility], [facility]  Social Connections: Unknown (03/02/2018)   Received from [facility], [facility]  Tobacco Use: Medium Risk (12/31/2022)    Readmission Risk Interventions     No data to display

## 2023-01-03 NOTE — Progress Notes (Signed)
Report called to Angela at [facility].  

## 2023-01-03 NOTE — Plan of Care (Signed)

## 2023-01-03 NOTE — Discharge Summary (Signed)
Physician Discharge Summary   Patient: Colleen [name] MRN: [id number] DOB: 12-25-1930  Admit date:     12/31/2022  Discharge date: 01/03/23  Discharge Physician: Delfino Lovett   PCP: Mick Sell, MD   Recommendations at discharge:    F/up with outpt providers as requested  Discharge Diagnoses: Principal Problem:   Pubic bone fracture [facility]) Active Problems:   Fall at home, initial encounter   Chronic diastolic CHF (congestive heart failure) (HCC)   CAD (coronary artery disease)   Hypertension   Hyperlipidemia   Atrial fibrillation, chronic (HCC)   Stroke (HCC)   Asthma   Chronic kidney disease, stage 3a (HCC)   Hypokalemia  Hospital Course: Assessment and Plan:  12 y f admitted after a fall and found to have pubic fracture. Orthopedic surgery recommends conservative mgmt PT/OT evaluations recommended SNF Going to [facility] for SNF today   Assessment and Plan:   Nondisplaced right inferior pubic ramus fracture s/p fall at home (initial encounter): X-ray showed nondisplaced right-sided pubic bone fracture near the symphysis and inferior pubic ramus.   --Ortho seen - Appreciate Dr. Polly Cobia recs --Schedule Tylenol --PRN Tramadol --Lidoderm patch if area is amenable - ortho evaluated and recommends to treat nonsurgically and has a high likelihood of healing well, although it may take 6 to 8 weeks to fully heal.  She may be mobilized with physical therapy, weightbearing as tolerated on the right leg with a walker for balance and support.  She may receive medication as deemed appropriate for pain as necessary/ordered.  Most likely, this patient will require rehab placement for period of time as she lives alone.   Chronic diastolic CHF (congestive heart failure) (HCC): 2D echo on 11/18/2017 showed EF of 60-65%.  Appears compensated. -Continue home Lasix -Monitor volume status - daily weights   CAD (coronary artery disease): S/p of stent placement.  No chest pain -Crestor    Hypertension - controlled on home regimen   Hyperlipidemia -Crestor   Atrial fibrillation, chronic (HCC): HR's controlled -Continue atenolol -Continue Pradaxa   H/o Stroke (HCC) -Crestor and Pradaxa   Asthma: Stable -Singulair -Bronchodilators as needed   Chronic kidney disease, stage 3a (HCC): stable Recent creatinine 1.0 on 10/23/2022.   Hypokalemia: repleted      Consultants: Ortho Disposition: Skilled nursing facility Diet recommendation:  Discharge Diet Orders (From admission, onward)     Start     Ordered   01/03/23 0000  Diet - low sodium heart healthy        01/03/23 1021           Carb modified diet DISCHARGE MEDICATION: Allergies as of 01/03/2023       Reactions   Ibuprofen Swelling   Codeine Anxiety   Hallucination.        Medication List     STOP taking these medications    senna 8.6 MG Tabs tablet Commonly known as: SENOKOT   spironolactone 25 MG tablet Commonly known as: ALDACTONE   UNABLE TO FIND       TAKE these medications    acetaminophen 325 MG tablet Commonly known as: TYLENOL Take 650 mg by mouth every 4 (four) hours as needed. for pain/ increased temp. May be administered orally, per G-tube if needed or rectally if unable to swallow (separate order). Maximum dose for 24 hours is 3,000 mg from all sources of Acetaminophen/ Tylenol   ascorbic acid 500 MG tablet Commonly known as: VITAMIN C Take 1,000 mg by mouth daily.  atenolol 50 MG tablet Commonly known as: TENORMIN Take 50 mg by mouth 2 (two) times daily.   b complex vitamins tablet Take 1 tablet by mouth daily.   Biotin 1 MG Caps Take 1 capsule by mouth daily.   Calcium Carbonate-Vitamin D3 600-400 MG-UNIT Tabs Take 1 tablet by mouth daily.   cetirizine 10 MG tablet Commonly known as: ZYRTEC Take 10 mg by mouth daily.   fluticasone 50 MCG/ACT nasal spray Commonly known as: FLONASE Place 2 sprays into both nostrils daily.   folic acid 400 MCG  tablet Commonly known as: FOLVITE Take 400 mcg by mouth daily.   furosemide 20 MG tablet Commonly known as: LASIX Take 20 mg by mouth daily.   Glucosamine-Chondroitin 250-200 MG Tabs Take 1 tablet by mouth 2 (two) times daily.   GLUCOSAMINE-FISH OIL-EPA-DHA PO Take by mouth. Take 1 capsule  (1,200-144-216 mg) by mouth daily for supplement   lidocaine 5 % Commonly known as: LIDODERM Place 1 patch onto the skin daily for 3 days. Remove & Discard patch within 12 hours or as directed by MD   lisinopril 40 MG tablet Commonly known as: ZESTRIL Take 1 tablet by mouth daily.   montelukast 10 MG tablet Commonly known as: SINGULAIR Take 10 mg by mouth at bedtime.   multivitamin with minerals Tabs tablet Take 1 tablet by mouth daily.   NIFEdipine 30 MG 24 hr tablet Commonly known as: PROCARDIA-XL/NIFEDICAL-XL Take 30 mg by mouth daily.   Pfizer-BioNT COVID-19 Vac-TriS Susp injection Generic drug: COVID-19 mRNA Vac-TriS (Pfizer) Inject into the muscle.   polyethylene glycol 17 g packet Commonly known as: MIRALAX / GLYCOLAX Take 17 g by mouth daily. Start taking on: January 04, 2023   Pradaxa 150 MG Caps capsule Generic drug: dabigatran Take 150 mg by mouth 2 (two) times daily.   rosuvastatin 10 MG tablet Commonly known as: CRESTOR Take 10 mg by mouth at bedtime.   senna-docusate 8.6-50 MG tablet Commonly known as: Senokot-S Take 1 tablet by mouth 2 (two) times daily.   traMADol 50 MG tablet Commonly known as: ULTRAM Take 1 tablet (50 mg total) by mouth every 6 (six) hours as needed for up to 3 days for moderate pain or severe pain. Take one tablet by mouth three times daily as needed for mod-severe pain What changed:  how much to take how to take this when to take this reasons to take this   Vitamin D-1000 Max St 25 MCG (1000 UT) tablet Generic drug: Cholecalciferol Take 1,000 Units by mouth daily.   vitamin E 180 MG (400 UNITS) capsule Take 400 Units by mouth  daily.        Contact information for after-discharge care     Destination     [facility] Preferred SNF .   Service: Skilled Nursing Contact information: [address] [place] [address] [phone]                    Discharge Exam: Ceasar Mons Weights   12/31/22 1734  Weight: 66.7 kg   General exam: awake, alert, no acute distress HEENT: atraumatic, clear conjunctiva, anicteric sclera, moist mucus membranes, hearing grossly normal  Respiratory system: CTAB, no wheezes, rales or rhonchi, normal respiratory effort. Cardiovascular system: normal S1/S2, RRR, no pedal edema.   Gastrointestinal system: soft, NT, ND, no HSM felt, +bowel sounds. Central nervous system: A&O x 4. no gross focal neurologic deficits, normal speech  Extremities: mild tenderness to palpation over the anterior aspect of the hip. more severe pain with and attempted straight leg raise Skin: dry, intact, normal temperature Psychiatry: normal mood, congruent affect, judgement and insight appear normal  Condition at discharge: good  The results of significant diagnostics from this hospitalization (including imaging, microbiology, ancillary and laboratory) are listed below for reference.   Imaging Studies: CT Head Wo Contrast  Result Date: 12/31/2022 CLINICAL DATA:  Recent fall with headaches and neck pain, initial encounter EXAM: CT HEAD WITHOUT CONTRAST CT CERVICAL SPINE WITHOUT CONTRAST TECHNIQUE: Multidetector CT imaging of the head and cervical spine was performed following the standard protocol without intravenous contrast. Multiplanar CT image reconstructions of the cervical spine were also generated. RADIATION DOSE REDUCTION: This exam was performed according to the departmental dose-optimization program which includes automated exposure control, adjustment of the mA and/or kV according to patient size  and/or use of iterative reconstruction technique. COMPARISON:  07/22/2007 FINDINGS: CT HEAD FINDINGS Brain: No evidence of acute infarction, hemorrhage, hydrocephalus, extra-axial collection or mass lesion/mass effect. Chronic white matter ischemic changes are noted. Diffuse basal ganglia calcifications are seen. Vascular: No hyperdense vessel or unexpected calcification. Skull: Normal. Negative for fracture or focal lesion. Sinuses/Orbits: Orbits and their contents are within normal limits. Chronic opacification in the left maxillary antrum is noted. Other: Scalp swelling is noted posteriorly in the right parietal region consistent with the recent injury. CT CERVICAL SPINE FINDINGS Alignment: Within normal limits. Skull base and vertebrae: 7 cervical segments are well visualized. Vertebral body height is well maintained. Multilevel osteophytic changes are seen. Facet hypertrophic changes are noted with minimal anterolisthesis of C4 on C5. No acute fracture or acute facet abnormality is noted. Soft tissues and spinal canal: Surrounding soft tissue structures are within normal limits. Upper chest: Lung apices demonstrate some pleural calcifications bilaterally. Other: None IMPRESSION: CT of the head: Chronic atrophic and ischemic changes. Scalp swelling on the right consistent the recent injury. CT of the cervical spine: No acute bony abnormality is noted. Multilevel degenerative changes are seen. Electronically Signed   By: Alcide Clever M.D.   On: 12/31/2022 19:05   CT Cervical Spine Wo Contrast  Result Date: 12/31/2022 CLINICAL DATA:  Recent fall with headaches and neck pain, initial encounter EXAM: CT HEAD WITHOUT CONTRAST CT CERVICAL SPINE WITHOUT CONTRAST TECHNIQUE: Multidetector CT imaging of the head and cervical spine was performed following the standard protocol without intravenous contrast. Multiplanar CT image reconstructions of the cervical spine were also generated. RADIATION DOSE REDUCTION: This  exam was performed according to the departmental dose-optimization program which includes automated exposure control, adjustment of the mA and/or kV according to patient size and/or use of iterative reconstruction technique. COMPARISON:  07/22/2007 FINDINGS: CT HEAD FINDINGS Brain: No evidence of acute infarction, hemorrhage, hydrocephalus, extra-axial collection or mass lesion/mass effect. Chronic white matter ischemic changes are noted. Diffuse basal ganglia calcifications are seen. Vascular: No hyperdense vessel or unexpected calcification. Skull: Normal. Negative for fracture or focal lesion. Sinuses/Orbits: Orbits and their contents are within normal limits. Chronic opacification in the left maxillary antrum is noted. Other: Scalp swelling is noted posteriorly in the right parietal region consistent with the recent injury. CT CERVICAL SPINE FINDINGS Alignment: Within normal limits. Skull base and vertebrae: 7 cervical segments are well visualized. Vertebral body height is well maintained. Multilevel osteophytic changes are seen. Facet hypertrophic changes are noted with minimal anterolisthesis of C4 on C5. No acute fracture or acute facet abnormality is noted. Soft tissues and  spinal canal: Surrounding soft tissue structures are within normal limits. Upper chest: Lung apices demonstrate some pleural calcifications bilaterally. Other: None IMPRESSION: CT of the head: Chronic atrophic and ischemic changes. Scalp swelling on the right consistent the recent injury. CT of the cervical spine: No acute bony abnormality is noted. Multilevel degenerative changes are seen. Electronically Signed   By: Alcide Clever M.D.   On: 12/31/2022 19:05   DG Hip Unilat W or Wo Pelvis 2-3 Views Right  Result Date: 12/31/2022 CLINICAL DATA:  Ground level fall.  Pain EXAM: DG HIP (WITH OR WITHOUT PELVIS) 3V RIGHT COMPARISON:  None Available. FINDINGS: Osteopenia. Hyperostosis. Mild degenerative changes seen of the sacroiliac joints  and hip joints. There is a subtle fracture suggested along the right side of the pubic symphysis extending into the inferior pubic ramus. No additional obvious fracture although with this level of osteopenia subtle injury is difficult to completely exclude and if needed additional workup with CT as clinically directed. IMPRESSION: Nondisplaced right-sided pubic bone fracture near the symphysis and inferior pubic ramus Osteopenia with chronic changes Electronically Signed   By: Karen Kays M.D.   On: 12/31/2022 18:36    Microbiology: Results for orders placed or performed during the hospital encounter of 11/16/17  Blood Culture (routine x 2)     Status: None   Collection Time: 11/16/17  3:03 PM   Specimen: BLOOD  Result Value Ref Range Status   Specimen Description BLOOD  Final   Special Requests NONE  Final   Culture   Final    NO GROWTH 5 DAYS Performed at [facility], [address], [place], [place] [address]    Report Status 11/21/2017 FINAL  Final  Blood Culture (routine x 2)     Status: None   Collection Time: 11/16/17  3:03 PM   Specimen: BLOOD RIGHT ARM  Result Value Ref Range Status   Specimen Description BLOOD RIGHT ARM  Final   Special Requests   Final    BOTTLES DRAWN AEROBIC AND ANAEROBIC Blood Culture adequate volume   Culture   Final    NO GROWTH 5 DAYS Performed at [facility], [address] [place], [place] [address]    Report Status 11/21/2017 FINAL  Final  Urine culture     Status: None   Collection Time: 11/16/17 10:48 PM   Specimen: Urine, Random  Result Value Ref Range Status   Specimen Description   Final    URINE, RANDOM Performed at [facility], [address] [place], [place] [address]    Special Requests   Final    NONE Performed at [facility], [address] [place], [place] [address]    Culture   Final    NO GROWTH Performed at [facility], [address] [place], [place] [address]     Report Status 11/18/2017 FINAL  Final  Body fluid culture     Status: None   Collection Time: 11/19/17  8:11 AM   Specimen: KNEE; Body Fluid  Result Value Ref Range Status   Specimen Description   Final    KNEE RIGHT KNEE Performed at [facility], [address] [place], [place] [address]    Special Requests   Final    Normal Performed at [facility], [address] [place], [place] [address]    Gram Stain   Final    MODERATE WBC PRESENT, PREDOMINANTLY PMN NO ORGANISMS SEEN    Culture  Final    NO GROWTH 3 DAYS Performed at [facility], [address] [place], [place] [address]    Report Status 11/22/2017 FINAL  Final    Labs: CBC: Recent Labs  Lab 12/31/22 2317 01/01/23 0537  WBC 11.1* 8.6  HGB 12.4 11.6*  HCT 38.0 35.0*  MCV 93.6 93.6  PLT 163 157   Basic Metabolic Panel: Recent Labs  Lab 12/31/22 2317 01/01/23 0537 01/02/23 0502 01/03/23 0319  NA 140 140 139 138  K 2.8* 3.2* 3.5 3.2*  CL 104 108 107 104  CO2 27 24 28 27   GLUCOSE 120* 120* 95 91  BUN 12 11 18 16   CREATININE 0.99 0.75 0.92 0.88  CALCIUM 9.0 8.6* 8.6* 8.5*  MG  --  2.0 2.2  --   PHOS  --  3.5  --   --    Liver Function Tests: No results for input(s): "AST", "ALT", "ALKPHOS", "BILITOT", "PROT", "ALBUMIN" in the last 168 hours. CBG: No results for input(s): "GLUCAP" in the last 168 hours.  Discharge time spent: greater than 30 minutes.  Signed: Delfino Lovett, MD [facility] 01/03/2023

## 2023-08-20 ENCOUNTER — Other Ambulatory Visit
Admission: RE | Admit: 2023-08-20 | Discharge: 2023-08-20 | Disposition: A | Discharge status: Home / Self Care | Source: Ambulatory Visit | Attending: Infectious Diseases | Admitting: Infectious Diseases

## 2023-08-20 DIAGNOSIS — Z7901 Long term (current) use of anticoagulants: Secondary | ICD-10-CM | POA: Diagnosis present

## 2023-08-20 DIAGNOSIS — I10 Essential (primary) hypertension: Secondary | ICD-10-CM | POA: Insufficient documentation

## 2023-08-20 DIAGNOSIS — I4891 Unspecified atrial fibrillation: Secondary | ICD-10-CM | POA: Insufficient documentation

## 2023-08-20 DIAGNOSIS — R600 Localized edema: Secondary | ICD-10-CM | POA: Insufficient documentation

## 2023-08-20 LAB — BRAIN NATRIURETIC PEPTIDE: B Natriuretic Peptide: 525.4 pg/mL — ABNORMAL HIGH (ref 0.0–100.0)

## 2023-09-03 ENCOUNTER — Other Ambulatory Visit
Admission: RE | Admit: 2023-09-03 | Discharge: 2023-09-03 | Disposition: A | Discharge status: Home / Self Care | Source: Ambulatory Visit | Attending: Infectious Diseases | Admitting: Infectious Diseases

## 2023-09-03 DIAGNOSIS — R7989 Other specified abnormal findings of blood chemistry: Secondary | ICD-10-CM | POA: Diagnosis present

## 2023-09-03 LAB — BRAIN NATRIURETIC PEPTIDE: B Natriuretic Peptide: 468.8 pg/mL — ABNORMAL HIGH (ref 0.0–100.0)

## 2023-09-27 ENCOUNTER — Other Ambulatory Visit: Payer: Self-pay | Admitting: Infectious Diseases

## 2023-09-27 DIAGNOSIS — I50812 Chronic right heart failure: Secondary | ICD-10-CM

## 2023-09-27 DIAGNOSIS — I10 Essential (primary) hypertension: Secondary | ICD-10-CM

## 2023-09-27 DIAGNOSIS — N1831 Chronic kidney disease, stage 3a: Secondary | ICD-10-CM

## 2023-09-27 DIAGNOSIS — I5032 Chronic diastolic (congestive) heart failure: Secondary | ICD-10-CM

## 2023-10-03 ENCOUNTER — Ambulatory Visit
Admission: RE | Admit: 2023-10-03 | Discharge: 2023-10-03 | Disposition: A | Discharge status: Home / Self Care | Source: Ambulatory Visit | Attending: Infectious Diseases | Admitting: Infectious Diseases

## 2023-10-03 DIAGNOSIS — I10 Essential (primary) hypertension: Secondary | ICD-10-CM | POA: Diagnosis present

## 2023-10-03 DIAGNOSIS — I5032 Chronic diastolic (congestive) heart failure: Secondary | ICD-10-CM

## 2023-10-03 DIAGNOSIS — I50812 Chronic right heart failure: Secondary | ICD-10-CM

## 2023-10-03 DIAGNOSIS — N1831 Chronic kidney disease, stage 3a: Secondary | ICD-10-CM | POA: Diagnosis present

## 2023-11-14 ENCOUNTER — Other Ambulatory Visit: Payer: Self-pay | Admitting: Infectious Diseases

## 2023-11-14 DIAGNOSIS — Z1231 Encounter for screening mammogram for malignant neoplasm of breast: Secondary | ICD-10-CM

## 2023-11-30 ENCOUNTER — Ambulatory Visit
Admission: RE | Admit: 2023-11-30 | Discharge: 2023-11-30 | Disposition: A | Discharge status: Home / Self Care | Source: Ambulatory Visit | Attending: Infectious Diseases | Admitting: Infectious Diseases

## 2023-11-30 DIAGNOSIS — Z1231 Encounter for screening mammogram for malignant neoplasm of breast: Secondary | ICD-10-CM | POA: Insufficient documentation
